# Patient Record
Sex: Female | Born: 1960 | Race: White | Hispanic: No | Marital: Married | Smoking: Never smoker
Health system: Southern US, Community
[De-identification: ages and names within clinical notes are randomized; demographics above are authoritative.]

## PROBLEM LIST (undated history)

## (undated) DIAGNOSIS — F410 Panic disorder [episodic paroxysmal anxiety] without agoraphobia: Secondary | ICD-10-CM

## (undated) DIAGNOSIS — I10 Essential (primary) hypertension: Secondary | ICD-10-CM

## (undated) DIAGNOSIS — I639 Cerebral infarction, unspecified: Secondary | ICD-10-CM

## (undated) HISTORY — DX: Cerebral infarction, unspecified: I63.9

## (undated) HISTORY — DX: Essential (primary) hypertension: I10

---

## 1998-09-22 ENCOUNTER — Other Ambulatory Visit: Admission: RE | Admit: 1998-09-22 | Discharge: 1998-09-22 | Payer: Self-pay | Admitting: Family Medicine

## 1999-09-15 ENCOUNTER — Other Ambulatory Visit: Admission: RE | Admit: 1999-09-15 | Discharge: 1999-09-15 | Payer: Self-pay | Admitting: Family Medicine

## 2000-09-04 ENCOUNTER — Other Ambulatory Visit: Admission: RE | Admit: 2000-09-04 | Discharge: 2000-09-04 | Payer: Self-pay | Admitting: Family Medicine

## 2001-09-18 ENCOUNTER — Other Ambulatory Visit: Admission: RE | Admit: 2001-09-18 | Discharge: 2001-09-18 | Payer: Self-pay | Admitting: Family Medicine

## 2002-09-30 ENCOUNTER — Other Ambulatory Visit: Admission: RE | Admit: 2002-09-30 | Discharge: 2002-09-30 | Payer: Self-pay | Admitting: Family Medicine

## 2003-11-13 ENCOUNTER — Other Ambulatory Visit: Admission: RE | Admit: 2003-11-13 | Discharge: 2003-11-13 | Payer: Self-pay | Admitting: Family Medicine

## 2004-12-08 ENCOUNTER — Ambulatory Visit: Payer: Self-pay | Admitting: Family Medicine

## 2004-12-08 ENCOUNTER — Other Ambulatory Visit: Admission: RE | Admit: 2004-12-08 | Discharge: 2004-12-08 | Payer: Self-pay | Admitting: Family Medicine

## 2015-11-11 ENCOUNTER — Observation Stay (HOSPITAL_COMMUNITY)
Admission: AD | Admit: 2015-11-11 | Discharge: 2015-11-13 | Disposition: A | Discharge status: Home / Self Care | Payer: BLUE CROSS/BLUE SHIELD | Source: Other Acute Inpatient Hospital | Attending: Internal Medicine | Admitting: Internal Medicine

## 2015-11-11 ENCOUNTER — Encounter (HOSPITAL_COMMUNITY): Payer: Self-pay | Admitting: Internal Medicine

## 2015-11-11 DIAGNOSIS — R29810 Facial weakness: Secondary | ICD-10-CM | POA: Diagnosis not present

## 2015-11-11 DIAGNOSIS — I63413 Cerebral infarction due to embolism of bilateral middle cerebral arteries: Secondary | ICD-10-CM | POA: Diagnosis not present

## 2015-11-11 DIAGNOSIS — Z793 Long term (current) use of hormonal contraceptives: Secondary | ICD-10-CM | POA: Insufficient documentation

## 2015-11-11 DIAGNOSIS — F329 Major depressive disorder, single episode, unspecified: Secondary | ICD-10-CM | POA: Insufficient documentation

## 2015-11-11 DIAGNOSIS — Z823 Family history of stroke: Secondary | ICD-10-CM | POA: Insufficient documentation

## 2015-11-11 DIAGNOSIS — E785 Hyperlipidemia, unspecified: Secondary | ICD-10-CM | POA: Clinically undetermined

## 2015-11-11 DIAGNOSIS — Z79899 Other long term (current) drug therapy: Secondary | ICD-10-CM | POA: Diagnosis not present

## 2015-11-11 DIAGNOSIS — I10 Essential (primary) hypertension: Secondary | ICD-10-CM | POA: Diagnosis present

## 2015-11-11 DIAGNOSIS — R4701 Aphasia: Secondary | ICD-10-CM | POA: Diagnosis not present

## 2015-11-11 DIAGNOSIS — I63419 Cerebral infarction due to embolism of unspecified middle cerebral artery: Secondary | ICD-10-CM | POA: Diagnosis not present

## 2015-11-11 DIAGNOSIS — I63512 Cerebral infarction due to unspecified occlusion or stenosis of left middle cerebral artery: Secondary | ICD-10-CM | POA: Diagnosis present

## 2015-11-11 DIAGNOSIS — I639 Cerebral infarction, unspecified: Secondary | ICD-10-CM | POA: Diagnosis present

## 2015-11-11 HISTORY — DX: Panic disorder (episodic paroxysmal anxiety): F41.0

## 2015-11-11 LAB — TSH: TSH: 3.298 u[IU]/mL (ref 0.350–4.500)

## 2015-11-11 LAB — COMPREHENSIVE METABOLIC PANEL
ALBUMIN: 3.6 g/dL (ref 3.5–5.0)
ALT: 22 U/L (ref 14–54)
ANION GAP: 6 (ref 5–15)
AST: 27 U/L (ref 15–41)
Alkaline Phosphatase: 61 U/L (ref 38–126)
BILIRUBIN TOTAL: 0.8 mg/dL (ref 0.3–1.2)
BUN: 11 mg/dL (ref 6–20)
CO2: 26 mmol/L (ref 22–32)
Calcium: 9.4 mg/dL (ref 8.9–10.3)
Chloride: 106 mmol/L (ref 101–111)
Creatinine, Ser: 1.11 mg/dL — ABNORMAL HIGH (ref 0.44–1.00)
GFR calc Af Amer: >60 mL/min (ref >60)
GFR, EST NON AFRICAN AMERICAN: 56 mL/min — AB (ref >60)
GLUCOSE: 102 mg/dL — AB (ref 65–99)
POTASSIUM: 3.5 mmol/L (ref 3.5–5.1)
Sodium: 138 mmol/L (ref 135–145)
TOTAL PROTEIN: 6.7 g/dL (ref 6.5–8.1)

## 2015-11-11 LAB — CBC WITH DIFFERENTIAL/PLATELET
BASOS ABS: 0 10*3/uL (ref 0.0–0.1)
BASOS PCT: 0 %
Eosinophils Absolute: 0.1 10*3/uL (ref 0.0–0.7)
Eosinophils Relative: 1 %
HCT: 40.3 % (ref 36.0–46.0)
Hemoglobin: 14 g/dL (ref 12.0–15.0)
LYMPHS PCT: 28 %
Lymphs Abs: 2.9 10*3/uL (ref 0.7–4.0)
MCH: 30.8 pg (ref 26.0–34.0)
MCHC: 34.7 g/dL (ref 30.0–36.0)
MCV: 88.6 fL (ref 78.0–100.0)
Monocytes Absolute: 0.8 10*3/uL (ref 0.1–1.0)
Monocytes Relative: 8 %
NEUTROS ABS: 6.5 10*3/uL (ref 1.7–7.7)
Neutrophils Relative %: 63 %
PLATELETS: 210 10*3/uL (ref 150–400)
RBC: 4.55 MIL/uL (ref 3.87–5.11)
RDW: 12.8 % (ref 11.5–15.5)
WBC: 10.3 10*3/uL (ref 4.0–10.5)

## 2015-11-11 LAB — GLUCOSE, CAPILLARY: GLUCOSE-CAPILLARY: 88 mg/dL (ref 65–99)

## 2015-11-11 MED ORDER — ASPIRIN 300 MG RE SUPP: 300.0000 mg | Freq: Every day | RECTAL | Status: DC

## 2015-11-11 MED ORDER — SODIUM CHLORIDE 0.9 % IV SOLN: INTRAVENOUS | Status: DC

## 2015-11-11 MED ORDER — SENNOSIDES-DOCUSATE SODIUM 8.6-50 MG PO TABS: 1.0000 | ORAL_TABLET | Freq: Every evening | ORAL | Status: DC | PRN

## 2015-11-11 MED ORDER — ENOXAPARIN SODIUM 40 MG/0.4ML ~~LOC~~ SOLN
40.0000 mg | SUBCUTANEOUS | Status: DC
  Administered 2015-11-12 – 2015-11-13 (×2): 40 mg via SUBCUTANEOUS
  Filled 2015-11-11 (×2): qty 0.4

## 2015-11-11 MED ORDER — SERTRALINE HCL 100 MG PO TABS
100.0000 mg | ORAL_TABLET | Freq: Every day | ORAL | Status: DC
  Administered 2015-11-12 – 2015-11-13 (×2): 100 mg via ORAL
  Filled 2015-11-11 (×2): qty 1

## 2015-11-11 MED ORDER — ASPIRIN 325 MG PO TABS: 325.0000 mg | ORAL_TABLET | Freq: Every day | ORAL | Status: DC

## 2015-11-11 MED ORDER — STROKE: EARLY STAGES OF RECOVERY BOOK
Freq: Once | Status: AC
  Administered 2015-11-12: 02:00:00
  Filled 2015-11-11: qty 1

## 2015-11-11 NOTE — H&P (Signed)
History and Physical    Meghan Dominguez ZOX:[id number] DOB: 08/05/1960 DOA: 11/11/2015  PCP: Irena ReichmannOLLINS, DANA, DO  Patient coming from: Patient was transferred from [facility].  Chief Complaint: Difficulty speaking.  HPI: Meghan BottsDanita H Dominguez is a 55 y.o. female with history of panic attacks was referred to the ER after patient's MRI brain showed features concerning for embolic stroke. Patient has been having these symptoms over the last 2-1/2 days with complaints of difficulty speaking the patient was having expressive aphasia with headache. Patient had gone to the [facility] after 24 hours of symptoms and had CT head done which was negative as per the patient was discharged home. At that time patient states her symptoms were better. But since symptoms were recurring  patient had gone her PCP who referred her to the ER yesterday and MRI of the brain was done which showed features concerning for embolic stroke. [facility] since there was [facility] at [facility]. Patient otherwise denies any difficulty swallowing or any blurred vision or denies any weakness of upper or lower extremity. Denies any palpitations chest pain or shortness of breath.  ED Course: See history of presenting illness.  Review of Systems: As per HPI, rest all negative.   Past Medical History  Diagnosis Date  . Panic attacks     History reviewed. No pertinent past surgical history.   reports that she has never smoked. She does not have any smokeless tobacco history on file. She reports that she does not drink alcohol or use illicit drugs.  No Known Allergies  Family History  Problem Relation Age of Onset  . Stroke Father   . Stroke Maternal Uncle     Prior to Admission medications   Medication Sig Start Date End Date Taking? Authorizing Provider  hydrochlorothiazide (HYDRODIURIL) 25 MG tablet Take 1 tablet by mouth daily. 11/10/15  Yes Historical  Provider, MD  ibuprofen (ADVIL,MOTRIN) 400 MG tablet Take 400 mg by mouth every 6 (six) hours as needed.   Yes Historical Provider, MD  Norgestimate-Ethinyl Estradiol Triphasic (TRI-PREVIFEM) 0.18/0.215/0.25 MG-35 MCG tablet Take 1 tablet by mouth daily. 10/23/15  Yes Historical Provider, MD  sertraline (ZOLOFT) 100 MG tablet Take 1 tablet by mouth daily. 10/26/15  Yes Historical Provider, MD    Physical Exam: Filed Vitals:   11/11/15 1846 11/11/15 2100  Pulse: 64   Temp: 98.3 F (36.8 C) 99.6 F (37.6 C)  TempSrc: Oral   Resp: 18   Height: 5\' 5"  (1.651 m)   SpO2: 98%       Constitutional: Not in acute distress. Filed Vitals:   11/11/15 1846 11/11/15 2100  Pulse: 64   Temp: 98.3 F (36.8 C) 99.6 F (37.6 C)  TempSrc: Oral   Resp: 18   Height: 5\' 5"  (1.651 m)   SpO2: 98%    Eyes: Anicteric no pallor. ENMT: No discharge from the ears eyes nose and mouth. Neck: No mass felt. No neck rigidity. Respiratory: No rhonchi or crepitations. Cardiovascular: S1 and S2 heard. Abdomen: Soft nontender bowel sounds present. Musculoskeletal: No edema. Skin: No rash. Neurologic: Alert awake oriented to time place and person. Has some expressive aphasia. No facial asymmetry. Tongue is midline. PERRLA positive. Moves all extremities 5 x 5. Psychiatric: Appears normal.   Labs on Admission: I have personally reviewed following labs and imaging studies  CBC: No results for input(s): WBC, NEUTROABS, HGB, HCT, MCV, PLT in the last 168 hours. Basic  Metabolic Panel: No results for input(s): NA, K, CL, CO2, GLUCOSE, BUN, CREATININE, CALCIUM, MG, PHOS in the last 168 hours. GFR: CrCl cannot be calculated (Unknown ideal weight.). Liver Function Tests: No results for input(s): AST, ALT, ALKPHOS, BILITOT, PROT, ALBUMIN in the last 168 hours. No results for input(s): LIPASE, AMYLASE in the last 168 hours. No results for input(s): AMMONIA in the last 168 hours. Coagulation Profile: No results for  input(s): INR, PROTIME in the last 168 hours. Cardiac Enzymes: No results for input(s): CKTOTAL, CKMB, CKMBINDEX, TROPONINI in the last 168 hours. BNP (last 3 results) No results for input(s): PROBNP in the last 8760 hours. HbA1C: No results for input(s): HGBA1C in the last 72 hours. CBG:  Recent Labs Lab 11/11/15 2145  GLUCAP 88   Lipid Profile: No results for input(s): CHOL, HDL, LDLCALC, TRIG, CHOLHDL, LDLDIRECT in the last 72 hours. Thyroid Function Tests: No results for input(s): TSH, T4TOTAL, FREET4, T3FREE, THYROIDAB in the last 72 hours. Anemia Panel: No results for input(s): VITAMINB12, FOLATE, FERRITIN, TIBC, IRON, RETICCTPCT in the last 72 hours. Urine analysis: No results found for: COLORURINE, APPEARANCEUR, LABSPEC, PHURINE, GLUCOSEU, HGBUR, BILIRUBINUR, KETONESUR, PROTEINUR, UROBILINOGEN, NITRITE, LEUKOCYTESUR Sepsis Labs: @LABRCNTIP (procalcitonin:4,lacticidven:4) )No results found for this or any previous visit (from the past 240 hour(s)).   Radiological Exams on Admission: No results found.  EKG: Independently reviewed. Normal sinus rhythm.  Assessment/Plan Principal Problem:   Embolic stroke [facility](HCC) Active Problems:   Stroke (cerebrum) (HCC)    1. Embolic stroke - I've discussed with on-call Neurologist Dr. Noel Christmasharles Stewart who will be seeing patient in consult. At this time I have placed patient on neuro checks, swalow evaluation will get MRA of the brain  2D echo carotid Doppler's closely monitor in telemetry. Get physical therapy consult. Presently on aspirin. Further recommendations per neurologist.  Patient did have elevated blood pressure recently but for now I'm holding off any antihypertensive and allowing for permissive hypertension. When necessary IV hydralazine only for systolic blood pressure more than 220.   DVT prophylaxis: Lovenox. Code Status: Full code.  Family Communication: Patient's mother and sister.  Disposition Plan: Home.  Consults  called: Neurologist.  Admission status: Observation. Telemetry.    Eduard ClosKAKRAKANDY,Mairin Lindsley N. MD [facility] Pager [phone].  If 7PM-7AM, please contact night-coverage [email or web address] Password [email or web address]  11/11/2015, 10:13 PM

## 2015-11-11 NOTE — Progress Notes (Signed)
Patient placed on telemetry stroke swallow screen completed. Q 2 hour vital signs and neuro checks initiated at 1830. Patient facial expression exhibited moderate amount of anxiety. RN reoriented patient to unit, provided an overview of stroke work-up, ensured her that RN will update her of any test or procedures if ordered and offered to contact patients family at 2010. Patient questioned if she was at the "right hospital". [facility] and was told of [facility]. Patient stated family was on their way to hospital. RN ensured patient that RN will continue to monitor and is happy to call family.

## 2015-11-12 ENCOUNTER — Observation Stay: Payer: Self-pay

## 2015-11-12 ENCOUNTER — Observation Stay (HOSPITAL_BASED_OUTPATIENT_CLINIC_OR_DEPARTMENT_OTHER): Payer: BLUE CROSS/BLUE SHIELD

## 2015-11-12 ENCOUNTER — Observation Stay (HOSPITAL_COMMUNITY): Payer: BLUE CROSS/BLUE SHIELD

## 2015-11-12 DIAGNOSIS — I6789 Other cerebrovascular disease: Secondary | ICD-10-CM

## 2015-11-12 DIAGNOSIS — E785 Hyperlipidemia, unspecified: Secondary | ICD-10-CM

## 2015-11-12 DIAGNOSIS — I63312 Cerebral infarction due to thrombosis of left middle cerebral artery: Secondary | ICD-10-CM

## 2015-11-12 DIAGNOSIS — I63413 Cerebral infarction due to embolism of bilateral middle cerebral arteries: Secondary | ICD-10-CM | POA: Diagnosis not present

## 2015-11-12 DIAGNOSIS — I63512 Cerebral infarction due to unspecified occlusion or stenosis of left middle cerebral artery: Secondary | ICD-10-CM | POA: Diagnosis not present

## 2015-11-12 DIAGNOSIS — I10 Essential (primary) hypertension: Secondary | ICD-10-CM

## 2015-11-12 LAB — LIPID PANEL
CHOLESTEROL: 217 mg/dL — AB (ref 0–200)
HDL: 42 mg/dL (ref >40)
LDL Cholesterol: 130 mg/dL — ABNORMAL HIGH (ref 0–99)
Total CHOL/HDL Ratio: 5.2 RATIO
Triglycerides: 227 mg/dL — ABNORMAL HIGH (ref <150)
VLDL: 45 mg/dL — AB (ref 0–40)

## 2015-11-12 LAB — ECHOCARDIOGRAM COMPLETE
CHL CUP MV DEC (S): 190
E/e' ratio: 10.72
EWDT: 190 ms
FS: 37 % (ref 28–44)
Height: 65 in
IVS/LV PW RATIO, ED: 1.01
LA ID, A-P, ES: 33 mm
LA diam end sys: 33 mm
LA vol: 30.7 mL
LAVOLA4C: 18.3 mL
LV E/e'average: 10.72
LV PW d: 12.6 mm — AB (ref 0.6–1.1)
LV TDI E'LATERAL: 8.92
LV TDI E'MEDIAL: 6.64
LVEEMED: 10.72
LVELAT: 8.92 cm/s
LVOT area: 3.14 cm2
LVOT diameter: 20 mm
MV pk A vel: 94.6 m/s
MV pk E vel: 95.6 m/s
MVPG: 4 mmHg
TAPSE: 20.3 mm

## 2015-11-12 LAB — RAPID URINE DRUG SCREEN, HOSP PERFORMED
Amphetamines: NOT DETECTED
BENZODIAZEPINES: NOT DETECTED
Barbiturates: NOT DETECTED
COCAINE: NOT DETECTED
OPIATES: NOT DETECTED
Tetrahydrocannabinol: NOT DETECTED

## 2015-11-12 LAB — SEDIMENTATION RATE: SED RATE: 36 mm/h — AB (ref 0–22)

## 2015-11-12 LAB — C-REACTIVE PROTEIN: CRP: 2.7 mg/dL — AB (ref <1.0)

## 2015-11-12 MED ORDER — ASPIRIN 300 MG RE SUPP: 300.0000 mg | Freq: Every day | RECTAL | Status: DC

## 2015-11-12 MED ORDER — HYDRALAZINE HCL 20 MG/ML IJ SOLN: 10.0000 mg | INTRAMUSCULAR | Status: DC | PRN

## 2015-11-12 MED ORDER — ATORVASTATIN CALCIUM 10 MG PO TABS
20.0000 mg | ORAL_TABLET | Freq: Every day | ORAL | Status: DC
  Administered 2015-11-12: 20 mg via ORAL
  Filled 2015-11-12: qty 2

## 2015-11-12 MED ORDER — ASPIRIN 325 MG PO TABS
325.0000 mg | ORAL_TABLET | Freq: Every day | ORAL | Status: DC
  Administered 2015-11-12 – 2015-11-13 (×2): 325 mg via ORAL
  Filled 2015-11-12 (×2): qty 1

## 2015-11-12 NOTE — Progress Notes (Signed)
STROKE TEAM PROGRESS NOTE   HISTORY OF PRESENT ILLNESS (per record) Meghan Dominguez is an 55 y.o. female with a history of depression and anxiety, as well as newly diagnosed hypertension, transferred from [facility] further evaluation of acute strokes. Exact onset is unclear. Patient had a headache on 11/09/2015 which was thought to be related to blood pressure and she was given a prescription for hydrochlorothiazide. She felt generally bad on the following day but had no clear deficits. She saw her primary doctor on 11/11/2015 who noticed speech output difficulty and referred [facility] for further evaluation. MRI showed no acute posterior left MCA ischemic infarction involving parietal temporal region. In addition there were anterior and posterior punctate ischemic lesions involving the right MCA territory. She has not experienced weakness and numbness involving extremities. Mild right facial droop has been noted. NIH stroke score was 2. Her LKW is unknown. Patient was not administered IV t-PA secondary to unknown LKW. She was admitted for further evaluation and treatment.   SUBJECTIVE (INTERVAL HISTORY) Her husband is at the bedside.  Overall she feels her condition is stable. She recounted HPI with Dr. Pearlean BrownieSethi. Speech difficulties remain - expressive aphasia. Husband concurs.    OBJECTIVE Temp:  [98.3 F (36.8 C)-99.6 F (37.6 C)] 99.6 F (37.6 C) (06/28 2100) Pulse Rate:  [62-73] 73 (06/29 0602) Cardiac Rhythm:  [-] Normal sinus rhythm (06/29 0700) Resp:  [18] 18 (06/28 1846) BP: (146-160)/(73-92) 156/92 mmHg (06/29 0602) SpO2:  [96 %-100 %] 100 % (06/29 0602)  CBC:   Recent Labs Lab 11/11/15 2239  WBC 10.3  NEUTROABS 6.5  HGB 14.0  HCT 40.3  MCV 88.6  PLT 210    Basic Metabolic Panel:   Recent Labs Lab 11/11/15 2239  NA 138  K 3.5  CL 106  CO2 26  GLUCOSE 102*  BUN 11  CREATININE 1.11*  CALCIUM 9.4    Lipid Panel:     Component Value  Date/Time   CHOL 217* 11/12/2015 0213   TRIG 227* 11/12/2015 0213   HDL 42 11/12/2015 0213   CHOLHDL 5.2 11/12/2015 0213   VLDL 45* 11/12/2015 0213   LDLCALC 130* 11/12/2015 0213   HgbA1c: No results found for: HGBA1C Urine Drug Screen:     Component Value Date/Time   LABOPIA NONE DETECTED 11/12/2015 0153   COCAINSCRNUR NONE DETECTED 11/12/2015 0153   LABBENZ NONE DETECTED 11/12/2015 0153   AMPHETMU NONE DETECTED 11/12/2015 0153   THCU NONE DETECTED 11/12/2015 0153   LABBARB NONE DETECTED 11/12/2015 0153     IMAGING  Mr Maxine GlennMra Head/brain Wo Cm  11/12/2015  CLINICAL DATA:  New onset speech difficulty. EXAM: MRA HEAD WITHOUT CONTRAST TECHNIQUE: Angiographic images of the Circle of Willis were obtained using MRA technique without intravenous contrast. COMPARISON:  Brain MRI from yesterday FINDINGS: Symmetric carotid arteries and branching. Mild siphon undulation is likely atherosclerotic in this patient with calcification in this region by recent head CT. No major branch occlusion or flow limiting stenosis. Symmetric vertebral arteries. Dominant right AICA and left PICA. Fetal type PCA on the left. No major branch occlusion or treatable flow limiting stenosis. Moderate left P3/4 segment narrowing that is likely atherosclerotic. No generalized beading. 1 mm outpouching from the right supraclinoid ICA which appears distinct from a tiny PCOM. IMPRESSION: 1. No major vessel occlusion or treatable stenosis. 2. Moderate distal left PCA stenosis. 3. 1 mm right supraclinoid ICA outpouching, usually infundibulum but small aneurysm cannot be excluded. Electronically Signed  By: Jonathon  Watts M.D.   On: 11/12/2015 11:33   Mr Outside Films Head/face  11/12/2015  This examination belongs to an outside facility and is stored here for comparison purposes only.  Contact the originating outside institution for any associated report or interpretation. Areas of acute infarct in the left posterior temporal and  parietal lobes consistent with left MCA branch vessel occlusion. Scattered small infarctions are seen in the right posterior frontal and parietal region consistent with small emboli n that region. No evidence of hemorrhage or mass effect.  PHYSICAL EXAM Pleasant middle-aged Caucasian lady currently not in distress. . Afebrile. Head is nontraumatic. Neck is supple without bruit.    Cardiac exam no murmur or gallop. Lungs are clear to auscultation. Distal pulses are well felt. Neurological Exam : Awake alert oriented. Mild expressive aphasia with nonfluent speech and word finding difficulties as well as occasional paraphasic errors. Good naming but poor repetition. Good comprehension. Follows 2 and 3 step commands. Pupils equal reactive. Extraocular moments are full range without nystagmus. Fundi were not visualized. Vision acuity seems adequate. Face is symmetric without weakness. Tongue is midline. Motor system exam revealed no upper or lower eczema to drift. Symmetric and equal strength in all 4 extremities. No focal weakness. Touch pinprick sensation are preserved bilaterally. Deep tendon pulses are 2+ symmetric. Plantars downgoing. Coordination is accurate. Gait was not tested. ASSESSMENT/PLAN Ms. Meghan Dominguez is a 54 y.o. female with history of depression, anxiety, and HTN presenting with HA 2 days ago and difficulty with speech output noted by PCP day of admission. She did not receive IV t-PA due to unknown LKW.   Stroke:  Left and right MCA infarcts, embolic secondary to unknown source  Had LUE numbness 2 days ago  Resultant  Expressive aphasia  MRI  R MCA anterior and posterior punctate infarcts  MRA  no significant stenosis, moderate distal left PCA stenosis. 1 mm right ICA infundibulum  Carotid Doppler  pending   2D Echo  pending   Hypercoagulable and vasculitic labs  TEE to look for embolic source. Arranged with [facility] for tomorrow.  If positive for  PFO (patent foramen ovale), check bilateral lower extremity venous dopplers to rule out DVT as possible source of stroke. (I have made patient NPO after midnight tonight).  If TEE negative, a [facility] electrophysiologist will consult and consider placement of an implantable loop recorder to evaluate for atrial fibrillation as etiology of stroke. This has been explained to patient/family by Dr. Naleyah Ohlinger and they are agreeable.   LDL 130  HgbA1c pending  Lovenox 40 mg sq daily for VTE prophylaxis Diet Heart Room service appropriate?: Yes; Fluid consistency:: Thin  No antithrombotic prior to admission, now on aspirin 325 mg daily  Patient counseled to be compliant with her antithrombotic medications  Ongoing aggressive stroke risk factor management  Therapy recommendations:  pending   Disposition:  pending   Hypertension  Stable  Permissive hypertension (OK if < 220/120) but gradually normalize in 5-7 days  Long-term BP goal normotensive  Hyperlipidemia  Home meds:  No statin  LDL 130, goal < 70  Added statin  Continue statin at discharge  Other Stroke Risk Factors  Family hx stroke (father & maternal uncle)  Other Active Problems  Hx anxiety  Hospital day # 1  BIBY,SHARON  [facility] See Amion for Pager information 11/12/2015 11:02 AM  I have personally examined this patient, reviewed notes, independently   viewed imaging studies, participated in medical decision making and plan of care. I have made any additions or clarifications directly to the above note. Agree with note above.  She presented with headache and expressive language difficulties due to embolic left MCA infarct but MRI also shows additional right hemispheric infarcts making a central cardiac source of embolism  likely. She remains at risk for recurrent strokes, TIAs, neurological worsening and needs ongoing stroke evaluation. She will also need TEE and loop recorder  insertion. I had a long discussion the patient and husband and the bedside regarding her stroke, risk for recurrent strokes, need for evaluation and answered questions. Greater than 50% time during this 35 minute visit was spent on counseling and coordination of care about stroke risk, prevention and treatment Delia HeadyPramod Lavana Huckeba, MD Medical Director [facility] Pager: [phone] 11/12/2015 3:22 PM    To contact Stroke Continuity provider, please refer to [email or web address]. After hours, contact General Neurology

## 2015-11-12 NOTE — Progress Notes (Signed)
PROGRESS NOTE    Meghan Dominguez  KCL:[id number] DOB: [birth year] DOA: 11/11/2015 PCP: Janie Morning, DO  Outpatient Specialists: None    Brief Narrative:   HPI: Meghan Dominguez is a 55 y.o. female with history of panic attacks was referred to the ER after patient's MRI brain showed features concerning for embolic stroke. Patient has been having these symptoms over the last 2-1/2 days with complaints of difficulty speaking the patient was having expressive aphasia with headache. Patient had gone to the ER at [facility] after 24 hours of symptoms and had CT head done which was negative as per the patient was discharged home. At that time patient states her symptoms were better. But since symptoms were recurring patient had gone her PCP who referred her to the ER yesterday and MRI of the brain was done which showed features concerning for embolic stroke. Patient was transferred to [facility] since there was no neurologist at [facility]. Patient otherwise denies any difficulty swallowing or any blurred vision or denies any weakness of upper or lower extremity. Denies any palpitations chest pain or shortness of breath.   Assessment & Plan:   Principal Problem:   Embolic stroke [facility]) Active Problems:   Stroke (cerebrum) (HCC)  Stroke - Left and right MCA infarcts, embolic secondary to unknown source, presenting with expressive aphasia (improved) Patient was admitted for complete evaluation and risk factor modification/initiation of treatment as follows: 1. Neurology consult 2. Neuro checks 3. Swallow eval - negative 4. MRI/MRA of brain - MRI with R MCA anterior and posterior punctate lesions; MRA with no significant steonsis, moderate distal left PCA stenosis, 1 mm right ICA infundibulum 5. Echo - pending TTE, but for TEE tomorrow 6. Carotid dopplers 7. ASA 325 mg daily 8. A1c pending 9. Lipid panel - LDL 130, will need initiation of statin.  Will start Lipitor 20 mg  daily for now. 10. Permissive HTN (IV Hydralazine prn SBP >220) 11.  PT/OT/ST - outpatient OT recommended with 24 hour supervision/assistance 12. Telemetry monitoring 13. Labs - CRP elevated at 2.7; pending labs: hypercoagulability work-up, HIV, RPR, ESR   DVT prophylaxis: Lovenox. Code Status: Full code.  Family Communication: Patient and her husband Disposition Plan: Home once cleared by neurology Consults called: Neurologist; Cardiology for TEE Admission status: Observation. Telemetry.    Consultants:   Neurology  Procedures:   Echo  Carotid dopplers  Antimicrobials:   None   Subjective: Reports ongoing improvement in speech/word finding.  Would like very much to go home today.  Objective: Filed Vitals:   11/12/15 0202 11/12/15 0402 11/12/15 0602 11/12/15 1408  BP: 146/73 156/77 156/92 163/84  Pulse: 63 63 73 73  Temp:    98.3 F (36.8 C)  TempSrc:    Oral  Resp:    18  Height:      SpO2: 96% 96% 100% 100%   No intake or output data in the 24 hours ending 11/12/15 1632 There were no vitals filed for this visit.  Examination:  General exam: Appears calm and comfortable  Respiratory system: Clear to auscultation. Respiratory effort normal. Cardiovascular system: S1 & S2 heard, RRR. No JVD, murmurs, rubs, gallops or clicks. No pedal edema. Gastrointestinal system: Abdomen is nondistended, soft and nontender. No organomegaly or masses felt. Normal bowel sounds heard. Central nervous system: Alert and oriented. No focal neurological deficits. Extremities: Symmetric 5 x 5 power. Skin: No rashes, lesions or ulcers Neuro: Grossly intact, mild word-finding difficulties but generally conversant and  in NAD Psychiatry: Judgement and insight appear normal. Mood & affect appropriate.     Data Reviewed: I have personally reviewed following labs and imaging studies  CBC:  Recent Labs Lab 11/11/15 2239  WBC 10.3  NEUTROABS 6.5  HGB 14.0  HCT 40.3  MCV 88.6    PLT 700   Basic Metabolic Panel:  Recent Labs Lab 11/11/15 2239  NA 138  K 3.5  CL 106  CO2 26  GLUCOSE 102*  BUN 11  CREATININE 1.11*  CALCIUM 9.4   GFR: CrCl cannot be calculated (Unknown ideal weight.). Liver Function Tests:  Recent Labs Lab 11/11/15 2239  AST 27  ALT 22  ALKPHOS 61  BILITOT 0.8  PROT 6.7  ALBUMIN 3.6   No results for input(s): LIPASE, AMYLASE in the last 168 hours. No results for input(s): AMMONIA in the last 168 hours. Coagulation Profile: No results for input(s): INR, PROTIME in the last 168 hours. Cardiac Enzymes: No results for input(s): CKTOTAL, CKMB, CKMBINDEX, TROPONINI in the last 168 hours. BNP (last 3 results) No results for input(s): PROBNP in the last 8760 hours. HbA1C: No results for input(s): HGBA1C in the last 72 hours. CBG:  Recent Labs Lab 11/11/15 2145  GLUCAP 88   Lipid Profile:  Recent Labs  11/12/15 0213  CHOL 217*  HDL 42  LDLCALC 130*  TRIG 227*  CHOLHDL 5.2   Thyroid Function Tests:  Recent Labs  11/11/15 2239  TSH 3.298   Anemia Panel: No results for input(s): VITAMINB12, FOLATE, FERRITIN, TIBC, IRON, RETICCTPCT in the last 72 hours. Urine analysis: No results found for: COLORURINE, APPEARANCEUR, LABSPEC, PHURINE, GLUCOSEU, HGBUR, BILIRUBINUR, KETONESUR, PROTEINUR, UROBILINOGEN, NITRITE, LEUKOCYTESUR Sepsis Labs: '@LABRCNTIP'$ (procalcitonin:4,lacticidven:4)  )No results found for this or any previous visit (from the past 240 hour(s)).       Radiology Studies: Mr Jodene Nam Head/brain Wo Cm  11/12/2015  CLINICAL DATA:  New onset speech difficulty. EXAM: MRA HEAD WITHOUT CONTRAST TECHNIQUE: Angiographic images of the Circle of Willis were obtained using MRA technique without intravenous contrast. COMPARISON:  Brain MRI from yesterday FINDINGS: Symmetric carotid arteries and branching. Mild siphon undulation is likely atherosclerotic in this patient with calcification in this region by recent head CT.  No major branch occlusion or flow limiting stenosis. Symmetric vertebral arteries. Dominant right AICA and left PICA. Fetal type PCA on the left. No major branch occlusion or treatable flow limiting stenosis. Moderate left P3/4 segment narrowing that is likely atherosclerotic. No generalized beading. 1 mm outpouching from the right supraclinoid ICA which appears distinct from a tiny PCOM. IMPRESSION: 1. No major vessel occlusion or treatable stenosis. 2. Moderate distal left PCA stenosis. 3. 1 mm right supraclinoid ICA outpouching, usually infundibulum but small aneurysm cannot be excluded. Electronically Signed   By: Monte Fantasia M.D.   On: 11/12/2015 11:33   Mr Outside Films Head/face  11/12/2015  This examination belongs to an outside facility and is stored here for comparison purposes only.  Contact the originating outside institution for any associated report or interpretation.       Scheduled Meds: . aspirin  325 mg Oral Daily   Or  . aspirin  300 mg Rectal Daily  . atorvastatin  20 mg Oral q1800  . enoxaparin (LOVENOX) injection  40 mg Subcutaneous Q24H  . sertraline  100 mg Oral Daily   Continuous Infusions: . sodium chloride       LOS: 1 day    Time spent: 30 min    Anderson Malta  Lorin [facility], MD [facility] Pager [phone]  If 7PM-7AM, please contact night-coverage [email or web address] Password [facility] 11/12/2015, 4:32 PM

## 2015-11-12 NOTE — Progress Notes (Signed)
*  PRELIMINARY RESULTS* Echocardiogram 2D Echocardiogram has been performed.  Jeryl Columbialliott, Devyne Hauger 11/12/2015, 4:46 PM

## 2015-11-12 NOTE — Consult Note (Signed)
Admission H&P    Chief Complaint: New onset speech difficulty.  HPI: Meghan Dominguez is an 55 y.o. female with a history of depression and anxiety, as well as newly diagnosed hypertension, transferred from [facility] further evaluation of acute strokes. Exact onset is unclear. Patient had a headache on 11/09/2015 which was thought to be related to blood pressure and she was given a prescription for hydrochlorothiazide. She felt generally bad on the following day but had no clear deficits. She saw her primary doctor on 11/11/2015 who noticed speech output difficulty and referred her to [facility] ED for further evaluation. MRI showed no acute posterior left MCA ischemic infarction involving parietal temporal region. In addition there were anterior and posterior punctate ischemic lesions involving the right MCA territory. She has not experienced weakness and numbness involving extremities. Mild right facial droop has been noted. NIH stroke score was 2.  LSN: Unclear tPA Given: No: Unclear when last known well mRankin:  Past Medical History  Diagnosis Date  . Panic attacks     History reviewed. No pertinent past surgical history.  Family History  Problem Relation Age of Onset  . Stroke Father   . Stroke Maternal Uncle    Social History:  reports that she has never smoked. She does not have any smokeless tobacco history on file. She reports that she does not drink alcohol or use illicit drugs.  Allergies: No Known Allergies  Medications Prior to Admission  Medication Sig Dispense Refill  . hydrochlorothiazide (HYDRODIURIL) 25 MG tablet Take 1 tablet by mouth daily.    Marland Kitchen ibuprofen (ADVIL,MOTRIN) 400 MG tablet Take 400 mg by mouth every 6 (six) hours as needed.    . Norgestimate-Ethinyl Estradiol Triphasic (TRI-PREVIFEM) 0.18/0.215/0.25 MG-35 MCG tablet Take 1 tablet by mouth daily.    . sertraline (ZOLOFT) 100 MG tablet Take 1 tablet by mouth daily.      ROS: History  obtained from the patient  General ROS: negative for - chills, fatigue, fever, night sweats, weight gain or weight loss Psychological ROS: negative for - behavioral disorder, hallucinations, memory difficulties, mood swings or suicidal ideation Ophthalmic ROS: negative for - blurry vision, double vision, eye pain or loss of vision ENT ROS: negative for - epistaxis, nasal discharge, oral lesions, sore throat, tinnitus or vertigo Allergy and Immunology ROS: negative for - hives or itchy/watery eyes Hematological and Lymphatic ROS: negative for - bleeding problems, bruising or swollen lymph nodes Endocrine ROS: negative for - galactorrhea, hair pattern changes, polydipsia/polyuria or temperature intolerance Respiratory ROS: negative for - cough, hemoptysis, shortness of breath or wheezing Cardiovascular ROS: negative for - chest pain, dyspnea on exertion, edema or irregular heartbeat Gastrointestinal ROS: negative for - abdominal pain, diarrhea, hematemesis, nausea/vomiting or stool incontinence Genito-Urinary ROS: negative for - dysuria, hematuria, incontinence or urinary frequency/urgency Musculoskeletal ROS: negative for - joint swelling or muscular weakness Neurological ROS: as noted in HPI Dermatological ROS: negative for rash and skin lesion changes  Physical Examination: Pulse 64, temperature 99.6 F (37.6 C), temperature source Oral, resp. rate 18, height '5\' 5"'$  (1.651 m), SpO2 98 %.  HEENT-  Normocephalic, no lesions, without obvious abnormality.  Normal external eye and conjunctiva.  Normal TM's bilaterally.  Normal auditory canals and external ears. Normal external nose, mucus membranes and septum.  Normal pharynx. Neck supple with no masses, nodes, nodules or enlargement. Cardiovascular - regular rate and rhythm, S1, S2 normal, no murmur, click, rub or gallop Lungs - chest clear, no wheezing, rales, normal  symmetric air entry Abdomen - soft, non-tender; bowel sounds normal; no  masses,  no organomegaly Extremities - no joint deformities, effusion, or inflammation and no edema  Neurologic Examination: Mental Status: Alert, oriented, thought content appropriate.  Moderate expressive aphasia with word finding difficulty, as well as occasional paraphasic error. Able to follow commands without difficulty. Cranial Nerves: II-Visual fields were normal. III/IV/VI-Pupils were equal and reacted normally to light. Extraocular movements were full and conjugate.    V/VII-no facial numbness; mild right lower facial weakness. VIII-normal. X-no dysarthria; symmetrical palatal movement. XI: trapezius strength/neck flexion strength normal bilaterally XII-midline tongue extension with normal strength. Motor: 5/5 bilaterally with normal tone and bulk Sensory: Normal throughout. Deep Tendon Reflexes: 2+ and symmetric. Plantars: Flexor bilaterally Cerebellar: Normal finger-to-nose testing. Carotid auscultation: Normal  Results for orders placed or performed during the hospital encounter of 11/11/15 (from the past 48 hour(s))  Glucose, capillary     Status: None   Collection Time: 11/11/15  9:45 PM  Result Value Ref Range   Glucose-Capillary 88 65 - 99 mg/dL   Comment 1 Notify RN    Comment 2 Document in Chart   Comprehensive metabolic panel     Status: Abnormal   Collection Time: 11/11/15 10:39 PM  Result Value Ref Range   Sodium 138 135 - 145 mmol/L   Potassium 3.5 3.5 - 5.1 mmol/L   Chloride 106 101 - 111 mmol/L   CO2 26 22 - 32 mmol/L   Glucose, Bld 102 (H) 65 - 99 mg/dL   BUN 11 6 - 20 mg/dL   Creatinine, Ser 1.11 (H) 0.44 - 1.00 mg/dL   Calcium 9.4 8.9 - 10.3 mg/dL   Total Protein 6.7 6.5 - 8.1 g/dL   Albumin 3.6 3.5 - 5.0 g/dL   AST 27 15 - 41 U/L   ALT 22 14 - 54 U/L   Alkaline Phosphatase 61 38 - 126 U/L   Total Bilirubin 0.8 0.3 - 1.2 mg/dL   GFR calc non Af Amer 56 (L) >60 mL/min   GFR calc Af Amer >60 >60 mL/min    Comment: (NOTE) The eGFR has been  calculated using the CKD EPI equation. This calculation has not been validated in all clinical situations. eGFR's persistently <60 mL/min signify possible Chronic Kidney Disease.    Anion gap 6 5 - 15  CBC with Differential/Platelet     Status: None   Collection Time: 11/11/15 10:39 PM  Result Value Ref Range   WBC 10.3 4.0 - 10.5 K/uL   RBC 4.55 3.87 - 5.11 MIL/uL   Hemoglobin 14.0 12.0 - 15.0 g/dL   HCT 40.3 36.0 - 46.0 %   MCV 88.6 78.0 - 100.0 fL   MCH 30.8 26.0 - 34.0 pg   MCHC 34.7 30.0 - 36.0 g/dL   RDW 12.8 11.5 - 15.5 %   Platelets 210 150 - 400 K/uL   Neutrophils Relative % 63 %   Neutro Abs 6.5 1.7 - 7.7 K/uL   Lymphocytes Relative 28 %   Lymphs Abs 2.9 0.7 - 4.0 K/uL   Monocytes Relative 8 %   Monocytes Absolute 0.8 0.1 - 1.0 K/uL   Eosinophils Relative 1 %   Eosinophils Absolute 0.1 0.0 - 0.7 K/uL   Basophils Relative 0 %   Basophils Absolute 0.0 0.0 - 0.1 K/uL  TSH     Status: None   Collection Time: 11/11/15 10:39 PM  Result Value Ref Range   TSH 3.298 0.350 - 4.500 uIU/mL  No results found.  Assessment: 55 y.o. female with multiple risk factors for stroke presenting with acute left posterior MCA territory ischemic infarction as well as multiple small punctate anterior and posterior right MCA territory infarctions. Pattern is suggestive of possible proximal embolus source.  Stroke Risk Factors - family history and hypertension  Plan: 1. HgbA1c, fasting lipid panel 2. MRI, MRA  of the brain without contrast 3. PT consult, OT consult, Speech consult 4. Echocardiogram 5. Carotid dopplers 6. Prophylactic therapy-Antiplatelet med: Aspirin 325 mg per day 7. Risk factor modification 8. Telemetry monitoring  C.R. Nicole Kindred, MD [facility] [phone]  11/12/2015, 12:47 AM

## 2015-11-12 NOTE — Discharge Summary (Addendum)
Physician Discharge Summary  Meghan Dominguez KZL:[id number] DOB: Dec 27, 1960 DOA: 11/11/2015  PCP: Janie Morning, DO  Admit date: 11/11/2015 Discharge date: 11/13/15  Time spent: 35 minutes  Recommendations for Outpatient Follow-up:  Patient will be discharged to home.  Suggest outpatient occupational therapy and 24 hour assistance for now.  Patient will need to follow up with primary care provider within one week of discharge for blood pressure check, blood work, and to arrange for 30 day heart monitor.  Patient should continue medications as prescribed.  Patient should follow a heart healthy diet.    Discharge Diagnoses:  Active Problems:   Stroke (cerebrum) (HCC)   Acute ischemic left MCA stroke (HCC)   Essential hypertension   Hyperlipidemia LDL goal <100   Discharge Condition: Improved  Diet recommendation: Heart healthy  Filed Weights   11/13/15 0825  Weight: 74.844 kg (165 lb)    History of present illness:  HPI: Meghan Dominguez is a 55 y.o. female with history of panic attacks was referred to the ER after patient's MRI brain showed features concerning for embolic stroke. Patient has been having these symptoms over the last 2-1/2 days with complaints of difficulty speaking the patient was having expressive aphasia with headache. Patient had gone to the ER at [facility] after 24 hours of symptoms and had CT head done which was negative as per the patient was discharged home. At that time patient states her symptoms were better. But since symptoms were recurring patient had gone her PCP who referred her to the ER yesterday and MRI of the brain was done which showed features concerning for embolic stroke. Patient was transferred to [facility] since there was no neurologist at [facility]. Patient otherwise denies any difficulty swallowing or any blurred vision or denies any weakness of upper or lower extremity. Denies any palpitations chest pain or shortness of  breath.  Hospital Course:  Principal Problem:  Embolic stroke [facility]) Active Problems:  Stroke (cerebrum) (HCC)  Stroke - Left and right MCA infarcts, embolic secondary to unknown source, presenting with expressive aphasia (improved) Patient was admitted for complete evaluation and risk factor modification/initiation of treatment as follows: 1. Neurology consult 2. Swallow eval - negative 3. MRI/MRA of brain - MRI with R MCA anterior and posterior punctate lesions; MRA with no significant steonsis, moderate distal left PCA stenosis, 1 mm right ICA infundibulum 4.  TTE - EF 60-65%; moderate LVH; normal wall motion; diastolic dysfunction; indeterminate LV filling pressure; normal LA size, normal IVC 5.  TEE - normal LV function; mild AI; negative saline microcavitation study 6. Carotid dopplers - 1-39% ICA plaquing.  Vertebral artery flow is antegrade. 7. ASA 325 mg daily 8. A1c 5.3 9. Lipid panel - LDL 130, will need initiation of statin. Will start Lipitor 20 mg daily for now. 10. Permissive HTN while hospitalization.  Will continue HCTZ 25 mg daily and start Lisinopril 10 mg daily. 11. PT/OT/ST - outpatient OT recommended with 24 hour supervision/assistance.  No PT required. 12. Labs - CRP elevated at 2.7; pending labs: hypercoagulability work-up, HIV, RPR, ESR 13. Electrophysiologist consult - Declines implantable loop recorder.  Prefers to have 30-day monitor arranged by PCP in [place]. 2. Discontinue birth control pills.  Procedures:  TTE  TEE  Carotid dopplers  Consultations:  Neurology  PT  OT  ST  Electrophysiology  Discharge Exam: Filed Vitals:   11/13/15 0930 11/13/15 1049  BP: 159/102 165/94  Pulse: 68 65  Temp:  97.7 F (  36.5 C)  Resp: 18 16     General: Well developed, well nourished, NAD, appears stated age; much less difficulty with speech today, minimal word searching, moderately good balance  HEENT: NCAT, PERRLA, EOMI, Anicteic Sclera,  mucous membranes moist.  Neck: Supple, no JVD, no masses  Cardiovascular: S1 S2 auscultated, no rubs, murmurs or gallops. Regular rate and rhythm.  Respiratory: Clear to auscultation bilaterally with equal chest rise  Abdomen: Soft, nontender, nondistended, + bowel sounds  Extremities: warm dry without cyanosis clubbing or edema  Neuro: AAOx3, cranial nerves grossly intact. Strength 5/5 in patient's upper and lower extremities bilaterally, ambulates without assistance and without difficulty  Skin: Without rashes exudates or nodules  Psych: Normal affect and demeanor with intact judgement and insight  Discharge Instructions      Discharge Instructions    Ambulatory referral to Neurology    Complete by:  As directed   Please schedule post stroke follow up in 2 months.     Call MD for:  difficulty breathing, headache or visual disturbances    Complete by:  As directed      Call MD for:  persistant dizziness or light-headedness    Complete by:  As directed      Call MD for:  severe uncontrolled pain    Complete by:  As directed      Call MD for:  temperature >100.4    Complete by:  As directed      Diet - low sodium heart healthy    Complete by:  As directed      Discharge instructions    Complete by:  As directed   Patient will be discharged to home.  Suggest outpatient occupational therapy and 24 hour assistance for now.  Patient will need to follow up with primary care provider within one week of discharge for blood pressure check, blood work, and to arrange for 30 day heart monitor.  Patient should continue medications as prescribed.  Patient should follow a heart healthy diet.     Increase activity slowly    Complete by:  As directed             Medication List    STOP taking these medications        TRI-PREVIFEM 0.18/0.215/0.25 MG-35 MCG tablet  Generic drug:  Norgestimate-Ethinyl Estradiol Triphasic      TAKE these medications        aspirin 325 MG tablet    Take 1 tablet (325 mg total) by mouth daily.     atorvastatin 20 MG tablet  Commonly known as:  LIPITOR  Take 1 tablet (20 mg total) by mouth daily at 6 PM.     hydrochlorothiazide 25 MG tablet  Commonly known as:  HYDRODIURIL  Take 1 tablet by mouth daily.     ibuprofen 400 MG tablet  Commonly known as:  ADVIL,MOTRIN  Take 400 mg by mouth every 6 (six) hours as needed.     lisinopril 10 MG tablet  Commonly known as:  PRINIVIL  Take 1 tablet (10 mg total) by mouth daily.     sertraline 100 MG tablet  Commonly known as:  ZOLOFT  Take 1 tablet by mouth daily.       No Known Allergies Follow-up Information    Follow up with SETHI,PRAMOD, MD In 2 months.   Specialties:  Neurology, Radiology   Why:  Stroke Clinic, Office will call you with appointment date & time   Contact information:  [address] [place] [address] [phone]       Follow up with COLLINS, DANA, DO In 1 week.   Specialty:  Family Medicine   Why:  check blood pressure and blood work in hospital follow-up   Contact information:   [place] [address] [phone]        The results of significant diagnostics from this hospitalization (including imaging, microbiology, ancillary and laboratory) are listed below for reference.    Significant Diagnostic Studies: Mr Jodene Nam Head/brain Wo Cm  11/12/2015  CLINICAL DATA:  New onset speech difficulty. EXAM: MRA HEAD WITHOUT CONTRAST TECHNIQUE: Angiographic images of the Circle of Willis were obtained using MRA technique without intravenous contrast. COMPARISON:  Brain MRI from yesterday FINDINGS: Symmetric carotid arteries and branching. Mild siphon undulation is likely atherosclerotic in this patient with calcification in this region by recent head CT. No major branch occlusion or flow limiting stenosis. Symmetric vertebral arteries. Dominant right AICA and left PICA. Fetal type PCA on the left. No major branch occlusion  or treatable flow limiting stenosis. Moderate left P3/4 segment narrowing that is likely atherosclerotic. No generalized beading. 1 mm outpouching from the right supraclinoid ICA which appears distinct from a tiny PCOM. IMPRESSION: 1. No major vessel occlusion or treatable stenosis. 2. Moderate distal left PCA stenosis. 3. 1 mm right supraclinoid ICA outpouching, usually infundibulum but small aneurysm cannot be excluded. Electronically Signed   By: Monte Fantasia M.D.   On: 11/12/2015 11:33   Mr Outside Films Head/face  11/12/2015  This examination belongs to an outside facility and is stored here for comparison purposes only.  Contact the originating outside institution for any associated report or interpretation.   Microbiology: No results found for this or any previous visit (from the past 240 hour(s)).   Labs: Basic Metabolic Panel:  Recent Labs Lab 11/11/15 2239  NA 138  K 3.5  CL 106  CO2 26  GLUCOSE 102*  BUN 11  CREATININE 1.11*  CALCIUM 9.4   Liver Function Tests:  Recent Labs Lab 11/11/15 2239  AST 27  ALT 22  ALKPHOS 61  BILITOT 0.8  PROT 6.7  ALBUMIN 3.6   No results for input(s): LIPASE, AMYLASE in the last 168 hours. No results for input(s): AMMONIA in the last 168 hours. CBC:  Recent Labs Lab 11/11/15 2239  WBC 10.3  NEUTROABS 6.5  HGB 14.0  HCT 40.3  MCV 88.6  PLT 210   Cardiac Enzymes: No results for input(s): CKTOTAL, CKMB, CKMBINDEX, TROPONINI in the last 168 hours. BNP: BNP (last 3 results) No results for input(s): BNP in the last 8760 hours.  ProBNP (last 3 results) No results for input(s): PROBNP in the last 8760 hours.  CBG:  Recent Labs Lab 11/11/15 2145  GLUCAP 88       Signed:  Karmen Bongo  [facility] 11/13/2015, 2:14 PM

## 2015-11-12 NOTE — Evaluation (Addendum)
Occupational Therapy Evaluation Patient Details Name: Meghan JunesDanita H Gorka MRN: [id number] DOB: 07/26/1960 Today's Date: 11/12/2015    History of Present Illness 55 y.o. female with a history of depression and anxiety, as well as newly diagnosed hypertension. MRI on 6/29 + for acute infarct in the left posterior temporal and parietal lobes consistent with left MCA branch vessel occlusion. Scattered small infarctions are seen in the right posterior frontal and parietal region consistent with small emboli in that region.     Clinical Impression   Pt reports she was independent with ADLs and mobility PTA; works in an office setting. Pt currently mod I with ADLs and functional mobility. Pt presenting with UB fine and gross motor coordination deficits and word finding difficulties. Educated pt on fine/gross motor coordination exercises and provided handout. Recommending outpatient OT and SLP at this time to assess cognition and assist with return to functional independence with work activities. Pt would benefit from continued skilled OT to address established goals.    Follow Up Recommendations  Outpatient OT;Supervision/Assistance - 24 hour;Other (comment) (Outpatient SLP)    Equipment Recommendations  None recommended by OT    Recommendations for Other Services Speech consult     Precautions / Restrictions Precautions Precautions: None Restrictions Weight Bearing Restrictions: No      Mobility Bed Mobility Overal bed mobility: Modified Independent             General bed mobility comments: HOB elevated.  Transfers Overall transfer level: Modified independent Equipment used: None             General transfer comment: No unsteadiness or LOB noted.    Balance Overall balance assessment: No apparent balance deficits (not formally assessed)                                          ADL Overall ADL's : Needs assistance/impaired                                        General ADL Comments: Pt overall mod I with ADLs and functional mobility. Pt presenting with UE fine and gross motor coordination deficits impacting participation in simulated work activities; typing, writing.      Vision Vision Assessment?: No apparent visual deficits   Perception     Praxis      Pertinent Vitals/Pain Pain Assessment: No/denies pain     Hand Dominance Right   Extremity/Trunk Assessment Upper Extremity Assessment Upper Extremity Assessment: RUE deficits/detail;LUE deficits/detail RUE Deficits / Details: AROM WFL. Strength grossly 4+/5. Deecreased fine and gross motor coordination. RUE Coordination: decreased fine motor;decreased gross motor LUE Deficits / Details: AROM WFL. Strength grossly 4+/5. Deecreased fine and gross motor coordination. LUE Coordination: decreased fine motor;decreased gross motor   Lower Extremity Assessment Lower Extremity Assessment: Defer to PT evaluation   Cervical / Trunk Assessment Cervical / Trunk Assessment: Normal   Communication Communication Communication: Expressive difficulties (word finding difficulties)   Cognition Arousal/Alertness: Awake/alert Behavior During Therapy: WFL for tasks assessed/performed Overall Cognitive Status: Difficult to assess                     General Comments       Exercises Exercises: Other exercises Other Exercises Other Exercises: Educated pt on fine and gross motor coordination activities  and provided handout.   Shoulder Instructions      Home Living Family/patient expects to be discharged to:: Private residence Living Arrangements: Spouse/significant other Available Help at Discharge: Family;Available PRN/intermittently (husband works during the day but can take time off if needed) Type of Home: House Home Access: Stairs to enter Entergy CorporationEntrance Stairs-Number of Steps: 4 Entrance Stairs-Rails: None Home Layout: Two level;Able to live on main level  with bedroom/bathroom     Bathroom Shower/Tub: Producer, television/film/videoWalk-in shower   Bathroom Toilet: Handicapped height     Home Equipment: None          Prior Functioning/Environment Level of Independence: Independent        Comments: works in an office, drives    OT Diagnosis: Cognitive deficits   OT Problem List: Decreased coordination;Decreased cognition;Decreased safety awareness   OT Treatment/Interventions: Therapeutic exercise;Therapeutic activities;Cognitive remediation/compensation;Patient/family education    OT Goals(Current goals can be found in the care plan section) Acute Rehab OT Goals Patient Stated Goal: return to PLOF OT Goal Formulation: With patient Time For Goal Achievement: 11/26/15 Potential to Achieve Goals: Good ADL Goals Pt/caregiver will Perform Home Exercise Program: Both right and left upper extremity;With written HEP provided;Independently (increase fine and gross motor coordination)  OT Frequency: Min 2X/week   Barriers to D/C:            Co-evaluation PT/OT/SLP Co-Evaluation/Treatment: Yes Reason for Co-Treatment: For patient/therapist safety   OT goals addressed during session: ADL's and self-care;Other (comment) (mobility)      End of Session    Activity Tolerance: Patient tolerated treatment well Patient left: in bed;with call bell/phone within reach;with bed alarm set   Time: [id number] OT Time Calculation (min): 36 min Charges:  OT General Charges $OT Visit: 1 Procedure OT Evaluation $OT Eval Moderate Complexity: 1 Procedure G-Codes: OT G-codes **NOT FOR INPATIENT CLASS** Functional Assessment Tool Used: Clinical judgement Functional Limitation: Self care Self Care Current Status (W1191(G8987): At least 1 percent but less than 20 percent impaired, limited or restricted Self Care Goal Status (Y7829(G8988): 0 percent impaired, limited or restricted   Gaye AlkenBailey A Sherby Moncayo M.S., OTR/L Pager: [phone]  11/12/2015, 3:59 PM

## 2015-11-12 NOTE — Evaluation (Signed)
Physical Therapy Evaluation Patient Details Name: Meghan JunesDanita H Haberl MRN: [id number] DOB: 11/13/1960 Today's Date: 11/12/2015   History of Present Illness  55 y.o. female with a history of depression and anxiety, as well as newly diagnosed hypertension. MRI on 6/29 + for acute infarct in the left posterior temporal and parietal lobes consistent with left MCA branch vessel occlusion. Scattered small infarctions are seen in the right posterior frontal and parietal region consistent with small emboli in that region.    Clinical Impression  Pt is at or close to baseline functioning for mobility, but has some planning, execution and word finding problems that can be addressed by OT/SLP.   Pt should be safe at home with family assist. There are no further acute PT needs.  Will sign off at this time.     Follow Up Recommendations No PT follow up    Equipment Recommendations  None recommended by PT    Recommendations for Other Services       Precautions / Restrictions Precautions Precautions: None Restrictions Weight Bearing Restrictions: No      Mobility  Bed Mobility Overal bed mobility: Modified Independent             General bed mobility comments: HOB elevated.  Transfers Overall transfer level: Modified independent Equipment used: None             General transfer comment: No unsteadiness or LOB noted.  Ambulation/Gait Ambulation/Gait assistance: Modified independent (Device/Increase time) Ambulation Distance (Feet): 200 Feet Assistive device: None Gait Pattern/deviations: Step-through pattern   Gait velocity interpretation: at or above normal speed for age/gender General Gait Details: steady and accepts moderate challenge well incl stepping over obstacles, abrupt turns, backing up, scanning and stairs  Stairs Stairs: Yes Stairs assistance: Modified independent (Device/Increase time) Stair Management: One rail Right;Alternating pattern;Forwards Number of  Stairs: 4 General stair comments: safe  Wheelchair Mobility    Modified Rankin (Stroke Patients Only) Modified Rankin (Stroke Patients Only) Pre-Morbid Rankin Score: No symptoms Modified Rankin: Moderate disability     Balance Overall balance assessment: Modified Independent                                           Pertinent Vitals/Pain Pain Assessment: No/denies pain    Home Living Family/patient expects to be discharged to:: Private residence Living Arrangements: Spouse/significant other Available Help at Discharge: Family;Available PRN/intermittently (husband works during the day but can take time off if needed) Type of Home: House Home Access: Stairs to enter Entrance Stairs-Rails: None Entrance Stairs-Number of Steps: 4 Home Layout: Two level;Able to live on main level with bedroom/bathroom Home Equipment: None      Prior Function Level of Independence: Independent         Comments: works in an office, drives     Hand Dominance   Dominant Hand: Right    Extremity/Trunk Assessment   Upper Extremity Assessment: RUE deficits/detail;LUE deficits/detail RUE Deficits / Details: AROM WFL. Strength grossly 4+/5. Deecreased fine and gross motor coordination.     LUE Deficits / Details: AROM WFL. Strength grossly 4+/5. Deecreased fine and gross motor coordination.   Lower Extremity Assessment: Overall WFL for tasks assessed      Cervical / Trunk Assessment: Normal  Communication   Communication: Expressive difficulties (word finding difficulties)  Cognition Arousal/Alertness: Awake/alert Behavior During Therapy: WFL for tasks assessed/performed Overall Cognitive Status: Difficult to assess  General Comments General comments (skin integrity, edema, etc.): Demos some difficulty with word finding, typing what is told to her or typing what she wants to type.  Think pt needs SLP cog/lang eval.    Exercises  Other Exercises Other Exercises: Educated pt on fine and gross motor coordination activities and provided handout.      Assessment/Plan    PT Assessment Patent does not need any further PT services  PT Diagnosis     PT Problem List    PT Treatment Interventions     PT Goals (Current goals can be found in the Care Plan section) Acute Rehab PT Goals Patient Stated Goal: return to PLOF PT Goal Formulation: All assessment and education complete, DC therapy    Frequency     Barriers to discharge        Co-evaluation PT/OT/SLP Co-Evaluation/Treatment: Yes Reason for Co-Treatment: For patient/therapist safety PT goals addressed during session: Mobility/safety with mobility OT goals addressed during session: ADL's and self-care;Other (comment) (mobility)       End of Session   Activity Tolerance: Patient tolerated treatment well Patient left: in bed Nurse Communication: Mobility status    Functional Assessment Tool Used: clinical judgement Functional Limitation: Mobility: Walking and moving around Mobility: Walking and Moving Around Current Status ([id number](G8978): 0 percent impaired, limited or restricted Mobility: Walking and Moving Around Discharge Status [phone](G8980): 0 percent impaired, limited or restricted    Time: [id number] PT Time Calculation (min) (ACUTE ONLY): 36 min   Charges:   PT Evaluation $PT Eval Low Complexity: 1 Procedure     PT G Codes:   PT G-Codes **NOT FOR INPATIENT CLASS** Functional Assessment Tool Used: clinical judgement Functional Limitation: Mobility: Walking and moving around Mobility: Walking and Moving Around Current Status (N8295(G8978): 0 percent impaired, limited or restricted Mobility: Walking and Moving Around Discharge Status (A2130(G8980): 0 percent impaired, limited or restricted    Claus Silvestro, Eliseo GumKenneth V 11/12/2015, 4:55 PM 11/12/2015  [place] BingKen Makeya Hilgert, PT [phone]  (pager)

## 2015-11-12 NOTE — Progress Notes (Addendum)
VASCULAR LAB PRELIMINARY  PRELIMINARY  PRELIMINARY  PRELIMINARY  Carotid duplex completed.    Preliminary report:  1-39% ICA plaquing.  Vertebral artery flow is antegrade.   Suhas Estis, RVT 11/12/2015, 4:36 PM

## 2015-11-12 NOTE — Progress Notes (Signed)
Entry Error

## 2015-11-13 ENCOUNTER — Observation Stay (HOSPITAL_BASED_OUTPATIENT_CLINIC_OR_DEPARTMENT_OTHER): Payer: BLUE CROSS/BLUE SHIELD

## 2015-11-13 ENCOUNTER — Encounter (HOSPITAL_COMMUNITY)
Admission: AD | Disposition: A | Discharge status: Home / Self Care | Payer: Self-pay | Source: Other Acute Inpatient Hospital | Attending: Internal Medicine

## 2015-11-13 ENCOUNTER — Encounter (HOSPITAL_COMMUNITY): Payer: Self-pay | Admitting: *Deleted

## 2015-11-13 DIAGNOSIS — I63512 Cerebral infarction due to unspecified occlusion or stenosis of left middle cerebral artery: Secondary | ICD-10-CM

## 2015-11-13 DIAGNOSIS — E785 Hyperlipidemia, unspecified: Secondary | ICD-10-CM | POA: Clinically undetermined

## 2015-11-13 DIAGNOSIS — I63413 Cerebral infarction due to embolism of bilateral middle cerebral arteries: Secondary | ICD-10-CM | POA: Diagnosis not present

## 2015-11-13 DIAGNOSIS — I351 Nonrheumatic aortic (valve) insufficiency: Secondary | ICD-10-CM | POA: Diagnosis not present

## 2015-11-13 DIAGNOSIS — I63419 Cerebral infarction due to embolism of unspecified middle cerebral artery: Secondary | ICD-10-CM

## 2015-11-13 DIAGNOSIS — I10 Essential (primary) hypertension: Secondary | ICD-10-CM | POA: Diagnosis present

## 2015-11-13 HISTORY — PX: TEE WITHOUT CARDIOVERSION: SHX5443

## 2015-11-13 LAB — CARDIOLIPIN ANTIBODIES, IGG, IGM, IGA
ANTICARDIOLIPIN IGM: 9 [MPL'U]/mL (ref 0–12)
Anticardiolipin IgA: <9 APL U/mL (ref 0–11)

## 2015-11-13 LAB — ECHO TEE: AOASC: 31 cm

## 2015-11-13 LAB — HEMOGLOBIN A1C
HEMOGLOBIN A1C: 5.3 % (ref 4.8–5.6)
MEAN PLASMA GLUCOSE: 105 mg/dL

## 2015-11-13 LAB — C4 COMPLEMENT: Complement C4, Body Fluid: 33 mg/dL (ref 14–44)

## 2015-11-13 LAB — LUPUS ANTICOAGULANT PANEL
DRVVT: 35.8 s (ref 0.0–47.0)
PTT LA: 33.4 s (ref 0.0–51.9)

## 2015-11-13 LAB — ANTINUCLEAR ANTIBODIES, IFA: ANTINUCLEAR ANTIBODIES, IFA: NEGATIVE

## 2015-11-13 LAB — COMPLEMENT, TOTAL: Compl, Total (CH50): >60 U/mL — ABNORMAL HIGH (ref 42–60)

## 2015-11-13 LAB — C3 COMPLEMENT: C3 Complement: 183 mg/dL — ABNORMAL HIGH (ref 82–167)

## 2015-11-13 LAB — RPR: RPR Ser Ql: NONREACTIVE

## 2015-11-13 LAB — HIV ANTIBODY (ROUTINE TESTING W REFLEX): HIV Screen 4th Generation wRfx: NONREACTIVE

## 2015-11-13 SURGERY — ECHOCARDIOGRAM, TRANSESOPHAGEAL
Anesthesia: Moderate Sedation

## 2015-11-13 MED ORDER — SODIUM CHLORIDE 0.9 % IV SOLN
INTRAVENOUS | Status: DC
  Administered 2015-11-13: 500 mL via INTRAVENOUS

## 2015-11-13 MED ORDER — LISINOPRIL 10 MG PO TABS: 10.0000 mg | ORAL_TABLET | Freq: Every day | ORAL | Status: DC

## 2015-11-13 MED ORDER — FENTANYL CITRATE (PF) 100 MCG/2ML IJ SOLN
INTRAMUSCULAR | Status: AC
  Filled 2015-11-13: qty 2

## 2015-11-13 MED ORDER — MIDAZOLAM HCL 10 MG/2ML IJ SOLN
INTRAMUSCULAR | Status: DC | PRN
  Administered 2015-11-13 (×3): 2 mg via INTRAVENOUS

## 2015-11-13 MED ORDER — ATORVASTATIN CALCIUM 20 MG PO TABS: 20.0000 mg | ORAL_TABLET | Freq: Every day | ORAL | Status: DC

## 2015-11-13 MED ORDER — FENTANYL CITRATE (PF) 100 MCG/2ML IJ SOLN
INTRAMUSCULAR | Status: DC | PRN
  Administered 2015-11-13 (×2): 25 ug via INTRAVENOUS

## 2015-11-13 MED ORDER — SODIUM CHLORIDE 0.9 % IV SOLN: INTRAVENOUS | Status: DC

## 2015-11-13 MED ORDER — ASPIRIN 325 MG PO TABS: 325.0000 mg | ORAL_TABLET | Freq: Every day | ORAL | Status: AC

## 2015-11-13 MED ORDER — MIDAZOLAM HCL 5 MG/ML IJ SOLN
INTRAMUSCULAR | Status: AC
  Filled 2015-11-13: qty 2

## 2015-11-13 MED ORDER — BUTAMBEN-TETRACAINE-BENZOCAINE 2-2-14 % EX AERO
INHALATION_SPRAY | CUTANEOUS | Status: DC | PRN
  Administered 2015-11-13: 2 via TOPICAL

## 2015-11-13 NOTE — Progress Notes (Signed)
Pt for discharge home today. Discharge orders received. IV and telemetry dcd with dressing clean dry and intact. Discharge instructions given with verbalized understanding. Prescriptions called into [facility]. SPeech therapy OP ordered. Stroke education and handouts given. Family at bedside to assist with discharge. Staff brought patient to lobby via wheelchair at this time. Transported to home by family member.

## 2015-11-13 NOTE — Care Management Note (Signed)
Case Management Note  Patient Details  Name: Meghan Dominguez MRN: [id number] Date of Birth: 12/21/1960  Subjective/Objective:                    Action/Plan: Pt discharging home with self care. OT recommending outpatient OT/ST. CM spoke with the MD and she is going to write a prescription for the outpatient services since the [place] lives in [place]. Will updated the bedside RN.   Expected Discharge Date:                  Expected Discharge Plan:  Home/Self Care  In-House Referral:     Discharge planning Services  CM Consult  Post Acute Care Choice:    Choice offered to:     DME Arranged:    DME Agency:     HH Arranged:    HH Agency:     Status of Service:  Completed, signed off  If discussed at [employer] Length of Stay Meetings, dates discussed:    Additional Comments:  Kermit BaloKelli F Machaela Caterino, RN 11/13/2015, 2:24 PM

## 2015-11-13 NOTE — Interval H&P Note (Signed)
History and Physical Interval Note:  11/13/2015 8:33 AM  Meghan Dominguez  has presented today for surgery, with the diagnosis of STROKE  The various methods of treatment have been discussed with the patient and family. After consideration of risks, benefits and other options for treatment, the patient has consented to  Procedure(s): TRANSESOPHAGEAL ECHOCARDIOGRAM (TEE)   (LOOP) (N/A) as a surgical intervention .  The patient's history has been reviewed, patient examined, no change in status, stable for surgery.  I have reviewed the patient's chart and labs.  Questions were answered to the patient's satisfaction.     Olga MillersBrian Nialah Saravia

## 2015-11-13 NOTE — Progress Notes (Signed)
Occupational Therapy Treatment Patient Details Name: Meghan Dominguez MRN: [id number] DOB: 04/02/1961 Today's Date: 11/13/2015    History of present illness 55 y.o. female with a history of depression and anxiety, as well as newly diagnosed hypertension. MRI on 6/29 + for acute infarct in the left posterior temporal and parietal lobes consistent with left MCA branch vessel occlusion. Scattered small infarctions are seen in the right posterior frontal and parietal region consistent with small emboli in that region.     OT comments  Pt able to perform toilet transfer and LB/UB dressing with mod I today. Tolerating fine/gross motor coordination exercises well. Improvements in coordination noted but continues to have residual deficits; R>L. Continue to recommend Outpatient OT and SLP for follow up. No further acute OT needs identified; signing off at this time. Please re-consult if needs change. Thank you for this referral.   Follow Up Recommendations  Outpatient OT;Supervision/Assistance - 24 hour;Other (comment) (Outpatient SLP)    Equipment Recommendations  None recommended by OT    Recommendations for Other Services Speech consult    Precautions / Restrictions Precautions Precautions: None Restrictions Weight Bearing Restrictions: No       Mobility Bed Mobility Overal bed mobility: Independent                Transfers Overall transfer level: Modified independent Equipment used: None             General transfer comment: No unsteadiness or LOB noted.    Balance Overall balance assessment: Modified Independent                                 ADL Overall ADL's : Modified independent                                       General ADL Comments: Pt able to gather clothing and dress self with mod I. Toilet transfer with mod I as well. Better fine/gross motor coordination today but still slightly impaired.      Vision                      Perception     Praxis      Cognition   Behavior During Therapy: [facility] for tasks assessed/performed Overall Cognitive Status: Within Functional Limits for tasks assessed                       Extremity/Trunk Assessment               Exercises Other Exercises Other Exercises: Pt able to return demo fine and gross motor coordination exercises.   Shoulder Instructions       General Comments      Pertinent Vitals/ Pain       Pain Assessment: No/denies pain  Home Living                                          Prior Functioning/Environment              Frequency       Progress Toward Goals  OT Goals(current goals can now be found in the care plan section)  Progress towards OT goals: Goals met/education completed, patient  discharged from OT  Acute Rehab OT Goals Patient Stated Goal: home today OT Goal Formulation: All assessment and education complete, DC therapy  Plan Discharge plan remains appropriate;All goals met and education completed, patient discharged from OT services    Co-evaluation                 End of Session     Activity Tolerance Patient tolerated treatment well   Patient Left with nursing/sitter in room;with family/visitor present   Nurse Communication          Time: 0141-0301 OT Time Calculation (min): 12 min  Charges: OT General Charges $OT Visit: 1 Procedure OT Treatments $Self Care/Home Management : 8-22 mins  Binnie Kand M.S., OTR/L Pager: [phone]  11/13/2015, 2:42 PM

## 2015-11-13 NOTE — Progress Notes (Signed)
OT Cancellation Note  Patient Details Name: Honor JunesDanita H Borman MRN: [id number] DOB: 07/10/1960   Cancelled Treatment:    Reason Eval/Treat Not Completed: Patient at procedure or test/ unavailable. Will follow up for OT treatment as time allows.  Gaye AlkenBailey A Kathrina Crosley M.S., OTR/L Pager: [phone]  11/13/2015, 9:28 AM

## 2015-11-13 NOTE — Progress Notes (Signed)
Echocardiogram Echocardiogram Transesophageal has been performed.  Meghan Dominguez 11/13/2015, 9:30 AM

## 2015-11-13 NOTE — H&P (View-Only) (Signed)
STROKE TEAM PROGRESS NOTE   HISTORY OF PRESENT ILLNESS (per record) Meghan Dominguez is an 55 y.o. female with a history of depression and anxiety, as well as newly diagnosed hypertension, transferred [facility] further evaluation of acute strokes. Exact onset is unclear. Patient had a headache on 11/09/2015 which was thought to be related to blood pressure and she was given a prescription for hydrochlorothiazide. She felt generally bad on the following day but had no clear deficits. She saw her primary doctor on 11/11/2015 who noticed speech output difficulty and referred [facility] for further evaluation. MRI showed no acute posterior left MCA ischemic infarction involving parietal temporal region. In addition there were anterior and posterior punctate ischemic lesions involving the right MCA territory. She has not experienced weakness and numbness involving extremities. Mild right facial droop has been noted. NIH stroke score was 2. Her LKW is unknown. Patient was not administered IV t-PA secondary to unknown LKW. She was admitted for further evaluation and treatment.   SUBJECTIVE (INTERVAL HISTORY) Her husband is at the bedside.  Overall she feels her condition is stable. She recounted HPI with Dr. Pearlean BrownieSethi. Speech difficulties remain - expressive aphasia. Husband concurs.    OBJECTIVE Temp:  [98.3 F (36.8 C)-99.6 F (37.6 C)] 99.6 F (37.6 C) (06/28 2100) Pulse Rate:  [62-73] 73 (06/29 0602) Cardiac Rhythm:  [-] Normal sinus rhythm (06/29 0700) Resp:  [18] 18 (06/28 1846) BP: (146-160)/(73-92) 156/92 mmHg (06/29 0602) SpO2:  [96 %-100 %] 100 % (06/29 0602)  CBC:   Recent Labs Lab 11/11/15 2239  WBC 10.3  NEUTROABS 6.5  HGB 14.0  HCT 40.3  MCV 88.6  PLT 210    Basic Metabolic Panel:   Recent Labs Lab 11/11/15 2239  NA 138  K 3.5  CL 106  CO2 26  GLUCOSE 102*  BUN 11  CREATININE 1.11*  CALCIUM 9.4    Lipid Panel:     Component Value  Date/Time   CHOL 217* 11/12/2015 0213   TRIG 227* 11/12/2015 0213   HDL 42 11/12/2015 0213   CHOLHDL 5.2 11/12/2015 0213   VLDL 45* 11/12/2015 0213   LDLCALC 130* 11/12/2015 0213   HgbA1c: No results found for: HGBA1C Urine Drug Screen:     Component Value Date/Time   LABOPIA NONE DETECTED 11/12/2015 0153   COCAINSCRNUR NONE DETECTED 11/12/2015 0153   LABBENZ NONE DETECTED 11/12/2015 0153   AMPHETMU NONE DETECTED 11/12/2015 0153   THCU NONE DETECTED 11/12/2015 0153   LABBARB NONE DETECTED 11/12/2015 0153     IMAGING  Mr Maxine GlennMra Head/brain Wo Cm  11/12/2015  CLINICAL DATA:  New onset speech difficulty. EXAM: MRA HEAD WITHOUT CONTRAST TECHNIQUE: Angiographic images of the Circle of Willis were obtained using MRA technique without intravenous contrast. COMPARISON:  Brain MRI from yesterday FINDINGS: Symmetric carotid arteries and branching. Mild siphon undulation is likely atherosclerotic in this patient with calcification in this region by recent head CT. No major branch occlusion or flow limiting stenosis. Symmetric vertebral arteries. Dominant right AICA and left PICA. Fetal type PCA on the left. No major branch occlusion or treatable flow limiting stenosis. Moderate left P3/4 segment narrowing that is likely atherosclerotic. No generalized beading. 1 mm outpouching from the right supraclinoid ICA which appears distinct from a tiny PCOM. IMPRESSION: 1. No major vessel occlusion or treatable stenosis. 2. Moderate distal left PCA stenosis. 3. 1 mm right supraclinoid ICA outpouching, usually infundibulum but small aneurysm cannot be excluded. Electronically Signed  By: Marnee Spring M.D.   On: 11/12/2015 11:33   Mr Outside Films Head/face  11/12/2015  This examination belongs to an outside facility and is stored here for comparison purposes only.  Contact the originating outside institution for any associated report or interpretation. Areas of acute infarct in the left posterior temporal and  parietal lobes consistent with left MCA branch vessel occlusion. Scattered small infarctions are seen in the right posterior frontal and parietal region consistent with small emboli n that region. No evidence of hemorrhage or mass effect.  PHYSICAL EXAM Pleasant middle-aged Caucasian lady currently not in distress. . Afebrile. Head is nontraumatic. Neck is supple without bruit.    Cardiac exam no murmur or gallop. Lungs are clear to auscultation. Distal pulses are well felt. Neurological Exam : Awake alert oriented. Mild expressive aphasia with nonfluent speech and word finding difficulties as well as occasional paraphasic errors. Good naming but poor repetition. Good comprehension. Follows 2 and 3 step commands. Pupils equal reactive. Extraocular moments are full range without nystagmus. Fundi were not visualized. Vision acuity seems adequate. Face is symmetric without weakness. Tongue is midline. Motor system exam revealed no upper or lower eczema to drift. Symmetric and equal strength in all 4 extremities. No focal weakness. Touch pinprick sensation are preserved bilaterally. Deep tendon pulses are 2+ symmetric. Plantars downgoing. Coordination is accurate. Gait was not tested. ASSESSMENT/PLAN Ms. ZYKERIAH MATHIA is a 55 y.o. female with history of depression, anxiety, and HTN presenting with HA 2 days ago and difficulty with speech output noted by PCP day of admission. She did not receive IV t-PA due to unknown LKW.   Stroke:  Left and right MCA infarcts, embolic secondary to unknown source  Had LUE numbness 2 days ago  Resultant  Expressive aphasia  MRI  R MCA anterior and posterior punctate infarcts  MRA  no significant stenosis, moderate distal left PCA stenosis. 1 mm right ICA infundibulum  Carotid Doppler  pending   2D Echo  pending   Hypercoagulable and vasculitic labs  TEE to look for embolic source. Arranged with [facility] for tomorrow.  If positive for  PFO (patent foramen ovale), check bilateral lower extremity venous dopplers to rule out DVT as possible source of stroke. (I have made patient NPO after midnight tonight).  If TEE negative, a [facility] electrophysiologist will consult and consider placement of an implantable loop recorder to evaluate for atrial fibrillation as etiology of stroke. This has been explained to patient/family by Dr. Pearlean Brownie and they are agreeable.   LDL 130  HgbA1c pending  Lovenox 40 mg sq daily for VTE prophylaxis Diet Heart Room service appropriate?: Yes; Fluid consistency:: Thin  No antithrombotic prior to admission, now on aspirin 325 mg daily  Patient counseled to be compliant with her antithrombotic medications  Ongoing aggressive stroke risk factor management  Therapy recommendations:  pending   Disposition:  pending   Hypertension  Stable  Permissive hypertension (OK if < 220/120) but gradually normalize in 5-7 days  Long-term BP goal normotensive  Hyperlipidemia  Home meds:  No statin  LDL 130, goal < 70  Added statin  Continue statin at discharge  Other Stroke Risk Factors  Family hx stroke (father & maternal uncle)  Other Active Problems  Hx anxiety  Hospital day # 1  BIBY,SHARON  [facility] See Amion for Pager information 11/12/2015 11:02 AM  I have personally examined this patient, reviewed notes, independently  viewed imaging studies, participated in medical decision making and plan of care. I have made any additions or clarifications directly to the above note. Agree with note above.  She presented with headache and expressive language difficulties due to embolic left MCA infarct but MRI also shows additional right hemispheric infarcts making a central cardiac source of embolism  likely. She remains at risk for recurrent strokes, TIAs, neurological worsening and needs ongoing stroke evaluation. She will also need TEE and loop recorder  insertion. I had a long discussion the patient and husband and the bedside regarding her stroke, risk for recurrent strokes, need for evaluation and answered questions. Greater than 50% time during this 35 minute visit was spent on counseling and coordination of care about stroke risk, prevention and treatment Delia HeadyPramod Sethi, MD Medical Director [facility] Pager: [phone] 11/12/2015 3:22 PM    To contact Stroke Continuity provider, please refer to [email or web address]. After hours, contact General Neurology

## 2015-11-13 NOTE — Progress Notes (Signed)
STROKE TEAM PROGRESS NOTE   SUBJECTIVE (INTERVAL HISTORY) Her husband is at the bedside.  She is back from TEE, which was negative for PFO. She has met with EP and refuses loop placement. Dr. Pearlean Brownie discussed at length with her and her husband. She is agreeable to stop BCP.   OBJECTIVE Temp:  [97.7 F (36.5 C)-99.2 F (37.3 C)] 97.7 F (36.5 C) (06/30 1049) Pulse Rate:  [64-76] 65 (06/30 1049) Cardiac Rhythm:  [-] Normal sinus rhythm (06/29 1900) Resp:  [13-19] 16 (06/30 1049) BP: (155-203)/(82-104) 165/94 mmHg (06/30 1049) SpO2:  [95 %-100 %] 99 % (06/30 1049) Weight:  [74.844 kg (165 lb)] 74.844 kg (165 lb) (06/30 0825)  CBC:   Recent Labs Lab 11/11/15 2239  WBC 10.3  NEUTROABS 6.5  HGB 14.0  HCT 40.3  MCV 88.6  PLT 210    Basic Metabolic Panel:   Recent Labs Lab 11/11/15 2239  NA 138  K 3.5  CL 106  CO2 26  GLUCOSE 102*  BUN 11  CREATININE 1.11*  CALCIUM 9.4    Lipid Panel:     Component Value Date/Time   CHOL 217* 11/12/2015 0213   TRIG 227* 11/12/2015 0213   HDL 42 11/12/2015 0213   CHOLHDL 5.2 11/12/2015 0213   VLDL 45* 11/12/2015 0213   LDLCALC 130* 11/12/2015 0213   HgbA1c:  Lab Results  Component Value Date   HGBA1C 5.3 11/12/2015   Urine Drug Screen:     Component Value Date/Time   LABOPIA NONE DETECTED 11/12/2015 0153   COCAINSCRNUR NONE DETECTED 11/12/2015 0153   LABBENZ NONE DETECTED 11/12/2015 0153   AMPHETMU NONE DETECTED 11/12/2015 0153   THCU NONE DETECTED 11/12/2015 0153   LABBARB NONE DETECTED 11/12/2015 0153     IMAGING  Mr Maxine Glenn Head/brain Wo Cm  11/12/2015  CLINICAL DATA:  New onset speech difficulty. EXAM: MRA HEAD WITHOUT CONTRAST TECHNIQUE: Angiographic images of the Circle of Willis were obtained using MRA technique without intravenous contrast. COMPARISON:  Brain MRI from yesterday FINDINGS: Symmetric carotid arteries and branching. Mild siphon undulation is likely atherosclerotic in this patient with calcification in  this region by recent head CT. No major branch occlusion or flow limiting stenosis. Symmetric vertebral arteries. Dominant right AICA and left PICA. Fetal type PCA on the left. No major branch occlusion or treatable flow limiting stenosis. Moderate left P3/4 segment narrowing that is likely atherosclerotic. No generalized beading. 1 mm outpouching from the right supraclinoid ICA which appears distinct from a tiny PCOM. IMPRESSION: 1. No major vessel occlusion or treatable stenosis. 2. Moderate distal left PCA stenosis. 3. 1 mm right supraclinoid ICA outpouching, usually infundibulum but small aneurysm cannot be excluded. Electronically Signed   By: Marnee Spring M.D.   On: 11/12/2015 11:33   Mr Outside Films Head/face  11/12/2015  This examination belongs to an outside facility and is stored here for comparison purposes only.  Contact the originating outside institution for any associated report or interpretation. Areas of acute infarct in the left posterior temporal and parietal lobes consistent with left MCA branch vessel occlusion. Scattered small infarctions are seen in the right posterior frontal and parietal region consistent with small emboli n that region. No evidence of hemorrhage or mass effect.  TEE  Normal LV function; mild AI; negative saline microcavitation study  Carotid Doppler   There is 1-39% bilateral ICA stenosis. Vertebral artery flow is antegrade.    2D Echocardiogram  Left ventricle: The cavity size was normal. Wall thickness  was increased in a pattern of moderate LVH. Systolic function was normal. The estimated ejection fraction was in the range of 60% to 65%. Wall motion was normal; there were no regional wall motion abnormalities. Doppler parameters are consistent with abnormal left ventricular relaxation (grade 1 diastolic dysfunction). The E/e&' ratio is between 8-15, suggesting indeterminate LV filling pressure.   PHYSICAL EXAM Pleasant middle-aged Caucasian lady  currently not in distress. . Afebrile. Head is nontraumatic. Neck is supple without bruit.    Cardiac exam no murmur or gallop. Lungs are clear to auscultation. Distal pulses are well felt. Neurological Exam : Awake alert oriented. Mild expressive aphasia with nonfluent speech and word finding difficulties as well as occasional paraphasic errors. Good naming but poor repetition. Good comprehension. Follows 2 and 3 step commands. Pupils equal reactive. Extraocular moments are full range without nystagmus. Fundi were not visualized. Vision acuity seems adequate. Face is symmetric without weakness. Tongue is midline. Motor system exam revealed no upper or lower eczema to drift. Symmetric and equal strength in all 4 extremities. No focal weakness. Touch pinprick sensation are preserved bilaterally. Deep tendon pulses are 2+ symmetric. Plantars downgoing. Coordination is accurate. Gait was not tested.   ASSESSMENT/PLAN Ms. Meghan Dominguez is a 55 y.o. female with history of depression, anxiety, and HTN presenting with HA 2 days ago and difficulty with speech output noted by PCP day of admission. She did not receive IV t-PA due to unknown LKW.   Stroke:  Left and right MCA infarcts, embolic secondary to unknown source  Had LUE numbness 2 days ago  Resultant  Expressive aphasia  MRI  R MCA anterior and posterior punctate infarcts  MRA  no significant stenosis, moderate distal left PCA stenosis. 1 mm right ICA infundibulum  Carotid Doppler  No significant stenosis   2D Echo  EF 60-65%. No source of embolus   Hypercoagulable and vasculitic labs negative thus far  TEE no PFO, no SOE.  Patient has refused loop Dr. Leonie Man disucssed with her and her husband further. She is going to consider. Plan 30 d monitor at discharge  LDL 130  HgbA1c 5.3  Lovenox 40 mg sq daily for VTE prophylaxis Diet Heart Room service appropriate?: Yes; Fluid consistency:: Thin  No antithrombotic prior to admission, now on  aspirin 325 mg daily  Patient counseled to be compliant with her antithrombotic medications  Therapy recommendations:  No PT, OP OT   Disposition:  Return home with OP OT, SLP  Patient has a 10-15% risk of having another stroke over the next year, the highest risk is within 2 weeks of the most recent stroke/TIA (risk of having a stroke following a stroke or TIA is the same).  Ongoing risk factor control by Primary Care Physician  Stroke Service will sign off. Please call should any needs arise.  Follow-up Stroke Clinic at [facility] with Dr. Antony Contras in 2 months, order placed.  Hypertension  Stable  Permissive hypertension (OK if < 220/120) but gradually normalize in 5-7 days  Long-term BP goal normotensive  Hyperlipidemia  Home meds:  No statin  LDL 130, goal < 70  Added statin  Continue statin at discharge  Other Stroke Risk Factors  Family hx stroke (father & maternal uncle)  Patient on BCP - has not gone through menopause per her - remains on BCP for birth control. Recommend she come off - asked to discuss with gynecologist for further recommendations   Other Active Problems  Hx anxiety  Hospital day # [place] for Pager information 11/13/2015 11:03 AM  I have personally examined this patient, reviewed notes, independently viewed imaging studies, participated in medical decision making and plan of care. I have made any additions or clarifications directly to the above note. Agree with note above.  I had a long discussion with the patient and husband regarding the findings on NTG and need to do long-term cardiac monitoring to look for paroxysmal atrial fibrillation. Patient has been reluctant to have loop recorder inserted today and wants to think about it. She may want to do 30 day outpatient cardiac monitor by her cardiologist in [place]. She will return for follow-up with me in the stroke clinic in 2  months or call earlier if necessary. Greater than 50% time during this 25 minute visit was spent on counseling and coordination of care about stroke risk, risk prevention and treatment  Antony Contras, MD Medical Director [place] Pager: [phone] 11/13/2015 3:35 PM   To contact Stroke Continuity provider, please refer to [email or web address] After hours, contact General Neurology

## 2015-11-13 NOTE — Consult Note (Signed)
ELECTROPHYSIOLOGY CONSULT NOTE  Patient ID: Meghan Dominguez MRN: [id number], DOB/AGE: 54/10/1960   Admit date: 11/11/2015 Date of Consult: 11/13/2015  Primary Physician: Irena ReichmannOLLINS, DANA, DO Primary Cardiologist: none Reason for Consultation: Cryptogenic stroke ; recommendations regarding Implantable Loop Recorder  History of Present Illness Meghan Dominguez was transferred [facility]/11/2015 with CVA.  She denies any known PMHx, only take OBC pills and zoloft chronically, recently started on HCTZ for episode of HTN noted at the ED a day prior to her admission to [facility].  They first developed symptoms while at home a couple days prior with vague symptoms of something not being right.  Imaging demonstrated Left and right MCA infarcts, embolic secondary to unknown source.  she has undergone workup for stroke including echocardiogram and carotid dopplers.  The patient has been monitored on telemetry which has demonstrated sinus rhythm with no arrhythmias.  Inpatient stroke work-up is to be completed with a TEE.   Echocardiogram this admission demonstrated Study Conclusions - Left ventricle: The cavity size was normal. Wall thickness was  increased in a pattern of moderate LVH. Systolic function was  normal. The estimated ejection fraction was in the range of 60%  to 65%. Wall motion was normal; there were no regional wall  motion abnormalities. Doppler parameters are consistent with  abnormal left ventricular relaxation (grade 1 diastolic  dysfunction). The E/e&' ratio is between 8-15, suggesting  indeterminate LV filling pressure. - Left atrium: The atrium was normal in size. - Inferior vena cava: The vessel was normal in size. The  respirophasic diameter changes were in the normal range (>= 50%),  consistent with normal central venous pressure. Impressions: - LVEF 60-65%, moderate LVH, normal wall motion, diastolic  dysfunction, indeterminate LV  filling pressure, normal LA size,  normal IVC.   Lab work is reviewed.  Prior to admission, the patient denies chest pain, shortness of breath, dizziness, palpitations, or syncope.  They are recovering from their stroke with disposition plans pending at discharge.  EP has been asked to evaluate for placement of an implantable loop recorder to monitor for atrial fibrillation.     Past Medical History  Diagnosis Date  . Panic attacks      Surgical History: History reviewed. No pertinent past surgical history.   Prescriptions prior to admission  Medication Sig Dispense Refill Last Dose  . hydrochlorothiazide (HYDRODIURIL) 25 MG tablet Take 1 tablet by mouth daily.   11/10/2015 at Unknown time  . ibuprofen (ADVIL,MOTRIN) 400 MG tablet Take 400 mg by mouth every 6 (six) hours as needed.   PRN  . Norgestimate-Ethinyl Estradiol Triphasic (TRI-PREVIFEM) 0.18/0.215/0.25 MG-35 MCG tablet Take 1 tablet by mouth daily.   11/10/2015 at Unknown time  . sertraline (ZOLOFT) 100 MG tablet Take 1 tablet by mouth daily.   11/10/2015 at Unknown time    Inpatient Medications:  . aspirin  325 mg Oral Daily   Or  . aspirin  300 mg Rectal Daily  . atorvastatin  20 mg Oral q1800  . enoxaparin (LOVENOX) injection  40 mg Subcutaneous Q24H  . sertraline  100 mg Oral Daily    Allergies: No Known Allergies  Social History   Social History  . Marital Status: Married    Spouse Name: N/A  . Number of Children: N/A  . Years of Education: N/A   Occupational History  . Not on file.   Social History Main Topics  . Smoking status: Never Smoker   .  Smokeless tobacco: Not on file  . Alcohol Use: No  . Drug Use: No  . Sexual Activity: Not on file   Other Topics Concern  . Not on file   Social History Narrative  . No narrative on file     Family History  Problem Relation Age of Onset  . Stroke Father   . Stroke Maternal Uncle       Review of Systems: All other systems reviewed and are  otherwise negative except as noted above.  Physical Exam: Filed Vitals:   Dec 10, 2015 1408 12/10/2015 2115 11/13/15 0149 11/13/15 0537  BP: 163/84 171/87 174/85 175/83  Pulse: 73 72 64 73  Temp: 98.3 F (36.8 C) 99.2 F (37.3 C) 98.3 F (36.8 C) 98 F (36.7 C)  TempSrc: Oral Oral Oral Oral  Resp: Height:      SpO2: 100% 97% 98% 98%    GEN- The patient is well appearing, alert and oriented x 3 today.   Head- normocephalic, atraumatic Eyes-  Sclera clear, conjunctiva pink Ears- hearing intact Oropharynx- clear Neck- supple Lungs- Clear to ausculation bilaterally, normal work of breathing Heart- Regular rate and rhythm, no murmurs, rubs or gallops  GI- soft, NT, ND Extremities- no clubbing, cyanosis, or edema MS- no significant deformity or atrophy Skin- no rash or lesion Psych- euthymic mood, full affect   Labs:   Lab Results  Component Value Date   WBC 10.3 11/11/2015   HGB 14.0 11/11/2015   HCT 40.3 11/11/2015   MCV 88.6 11/11/2015   PLT 210 11/11/2015    Recent Labs Lab 11/11/15 2239  NA 138  K 3.5  CL 106  CO2 26  BUN 11  CREATININE 1.11*  CALCIUM 9.4  PROT 6.7  BILITOT 0.8  ALKPHOS 61  ALT 22  AST 27  GLUCOSE 102*   No results found for: CKTOTAL, CKMB, CKMBINDEX, TROPONINI Lab Results  Component Value Date   CHOL 217* 10-Dec-2015   Lab Results  Component Value Date   HDL 42 Dec 10, 2015   Lab Results  Component Value Date   LDLCALC 130* Dec 10, 2015   Lab Results  Component Value Date   TRIG 227* 10-Dec-2015   Lab Results  Component Value Date   CHOLHDL 5.2 2015/12/10     Radiology/Studies: Mr Maxine Glenn Head/brain Wo Cm Dec 10, 2015  CLINICAL DATA:  New onset speech difficulty. EXAM: MRA HEAD WITHOUT CONTRAST TECHNIQUE: Angiographic images of the Circle of Willis were obtained using MRA technique without intravenous contrast. COMPARISON:  Brain MRI from yesterday FINDINGS: Symmetric carotid arteries and branching. Mild siphon undulation  is likely atherosclerotic in this patient with calcification in this region by recent head CT. No major branch occlusion or flow limiting stenosis. Symmetric vertebral arteries. Dominant right AICA and left PICA. Fetal type PCA on the left. No major branch occlusion or treatable flow limiting stenosis. Moderate left P3/4 segment narrowing that is likely atherosclerotic. No generalized beading. 1 mm outpouching from the right supraclinoid ICA which appears distinct from a tiny PCOM. IMPRESSION: 1. No major vessel occlusion or treatable stenosis. 2. Moderate distal left PCA stenosis. 3. 1 mm right supraclinoid ICA outpouching, usually infundibulum but small aneurysm cannot be excluded. Electronically Signed   By: Marnee Spring M.D.   On: Dec 10, 2015 11:33   Mr Outside Films Head/face December 10, 2015  This examination belongs to an outside facility and is stored here for comparison purposes only.  Contact the originating outside institution for any associated report or interpretation.  12-lead ECG SR All prior EKG's in EPIC reviewed with no documented atrial fibrillation  Telemetry SR only  Assessment and Plan:  1. Cryptogenic stroke The patient presents with cryptogenic stroke.  The patient has a TEE planned for this AM.  I spoke at length with the patient about monitoring for afib with either a 30 day event monitor or an implantable loop recorder.  Risks, benefits, and alteratives to implantable loop recorder were discussed with the patient today.   At this time, the patient is undecided on how she would like to proceed from a monitoring standpoint, she and her husband would like to give it some more thought.    Renee Norberto SorensonLynn Ursuy, PA-C 11/13/2015  I have seen, examined the patient, and reviewed the above assessment and plan.  On exam, RRR.   Some difficulty with thought processing. Changes to above are made where necessary.     I have spoken at length with patient and family member at bedside.  The  patient is clear that she does not wish to have implantable loop recorder placed.  She would prefer to have 30 day monitor arranged by primary care physician in [place] (so that she does not have to come back to [place] to have this placed).  Primary team to arrange.  Electrophysiology team to see as needed while here. Please call with questions.   Co Sign: Hillis RangeJames Jasara Corrigan, MD 11/13/2015 7:55 AM

## 2015-11-13 NOTE — CV Procedure (Signed)
See full TEE report in [employer] Pt sedated with versed 6 mg and fentanyl 50 micrograms IV. Normal LV function; mild AI; negative saline microcavitation study; full report to follow. Olga MillersBrian Bretta Fees, MD

## 2015-11-14 LAB — VAS US CAROTID
LEFT ECA DIAS: -10 cm/s
LEFT VERTEBRAL DIAS: -17 cm/s
LICADDIAS: -37 cm/s
LICAPDIAS: -18 cm/s
Left CCA dist dias: -19 cm/s
Left CCA dist sys: -75 cm/s
Left CCA prox dias: 16 cm/s
Left CCA prox sys: 97 cm/s
Left ICA dist sys: -115 cm/s
Left ICA prox sys: -44 cm/s
RIGHT ECA DIAS: -17 cm/s
RIGHT VERTEBRAL DIAS: -16 cm/s
Right CCA prox dias: -23 cm/s
Right CCA prox sys: -119 cm/s
Right cca dist sys: -70 cm/s

## 2015-11-15 ENCOUNTER — Encounter (HOSPITAL_COMMUNITY): Payer: Self-pay | Admitting: Cardiology

## 2015-11-19 LAB — PROTHROMBIN GENE MUTATION

## 2016-01-13 ENCOUNTER — Encounter: Payer: Self-pay | Admitting: Neurology

## 2016-01-13 ENCOUNTER — Ambulatory Visit (INDEPENDENT_AMBULATORY_CARE_PROVIDER_SITE_OTHER): Payer: BLUE CROSS/BLUE SHIELD | Admitting: Neurology

## 2016-01-13 VITALS — BP 156/79 | HR 51 | Ht 65.0 in | Wt 190.4 lb

## 2016-01-13 DIAGNOSIS — I639 Cerebral infarction, unspecified: Secondary | ICD-10-CM

## 2016-01-13 NOTE — Patient Instructions (Signed)
I had a long d/w patient about his recent stroke, risk for recurrent stroke/TIAs, personally independently reviewed imaging studies and stroke evaluation results and answered questions.Continue aspirin 325 mg daily  for secondary stroke prevention and maintain strict control of hypertension with blood pressure goal below 130/90, diabetes with hemoglobin A1c goal below 6.5% and lipids with LDL cholesterol goal below 70 mg/dL. I also advised the patient to eat a healthy diet with plenty of whole grains, cereals, fruits and vegetables, exercise regularly and maintain ideal body weight. I counseled the patient to take statins to control cholesterol. I recommend she start taking coenzyme Q 10 200 mg daily and consider switching from Lipitor to Zocor or Crestor and to discuss this with her primary physician. She may also consider possible participation in the RESPECT ESUS trial if interested. She was given written information to review at home and decide. Followup in the future with me my nurse practitioner in 6 months or call earlier if necessary  Stroke Prevention Some medical conditions and behaviors are associated with an increased chance of having a stroke. You may prevent a stroke by making healthy choices and managing medical conditions. HOW CAN I REDUCE MY RISK OF HAVING A STROKE?   Stay physically active. Get at least 30 minutes of activity on most or all days.  Do not smoke. It may also be helpful to avoid exposure to secondhand smoke.  Limit alcohol use. Moderate alcohol use is considered to be:  No more than 2 drinks per day for men.  No more than 1 drink per day for nonpregnant women.  Eat healthy foods. This involves:  Eating 5 or more servings of fruits and vegetables a day.  Making dietary changes that address high blood pressure (hypertension), high cholesterol, diabetes, or obesity.  Manage your cholesterol levels.  Making food choices that are high in fiber and low in saturated  fat, trans fat, and cholesterol may control cholesterol levels.  Take any prescribed medicines to control cholesterol as directed by your health care provider.  Manage your diabetes.  Controlling your carbohydrate and sugar intake is recommended to manage diabetes.  Take any prescribed medicines to control diabetes as directed by your health care provider.  Control your hypertension.  Making food choices that are low in salt (sodium), saturated fat, trans fat, and cholesterol is recommended to manage hypertension.  Ask your health care provider if you need treatment to lower your blood pressure. Take any prescribed medicines to control hypertension as directed by your health care provider.  If you are 52-39 years of age, have your blood pressure checked every 3-5 years. If you are 70 years of age or older, have your blood pressure checked every year.  Maintain a healthy weight.  Reducing calorie intake and making food choices that are low in sodium, saturated fat, trans fat, and cholesterol are recommended to manage weight.  Stop drug abuse.  Avoid taking birth control pills.  Talk to your health care provider about the risks of taking birth control pills if you are over 41 years old, smoke, get migraines, or have ever had a blood clot.  Get evaluated for sleep disorders (sleep apnea).  Talk to your health care provider about getting a sleep evaluation if you snore a lot or have excessive sleepiness.  Take medicines only as directed by your health care provider.  For some people, aspirin or blood thinners (anticoagulants) are helpful in reducing the risk of forming abnormal blood clots that can lead  to stroke. If you have the irregular heart rhythm of atrial fibrillation, you should be on a blood thinner unless there is a good reason you cannot take them.  Understand all your medicine instructions.  Make sure that other conditions (such as anemia or atherosclerosis) are  addressed. SEEK IMMEDIATE MEDICAL CARE IF:   You have sudden weakness or numbness of the face, arm, or leg, especially on one side of the body.  Your face or eyelid droops to one side.  You have sudden confusion.  You have trouble speaking (aphasia) or understanding.  You have sudden trouble seeing in one or both eyes.  You have sudden trouble walking.  You have dizziness.  You have a loss of balance or coordination.  You have a sudden, severe headache with no known cause.  You have new chest pain or an irregular heartbeat. Any of these symptoms may represent a serious problem that is an emergency. Do not wait to see if the symptoms will go away. Get medical help at once. Call your local emergency services (911 in [place] Do not drive yourself to the hospital.   This information is not intended to replace advice given to you by your health care provider. Make sure you discuss any questions you have with your health care provider.   Document Released: 06/09/2004 Document Revised: 05/23/2014 Document Reviewed: 11/02/2012 Elsevier Interactive Patient Education [employer]2016 [employer]

## 2016-01-13 NOTE — Progress Notes (Signed)
[facility] [address] [place]. [place] [address] [phone]       OFFICE FOLLOW-UP NOTE  Meghan. Marquesa Rath Brusca Date of Birth:  1960/08/24 Medical Record Number:  [id number]   HPI: Meghan Dominguez is a 59 year Caucasian lady seen today for first office follow-up visit following hospital admission for stroke in June 2017.Keyshawna H Keeny is an 55 y.o. female with a history of depression and anxiety, as well as newly diagnosed hypertension, transferred from [facility] further evaluation of acute strokes. Exact onset is unclear. Patient had a headache on 11/09/2015 which was thought to be related to blood pressure and she was given a prescription for hydrochlorothiazide. She felt generally bad on the following day but had no clear deficits. She saw her primary doctor on 11/11/2015 who noticed speech output difficulty and referred her to [facility] ED for further evaluation. MRI showed no acute posterior left MCA ischemic infarction involving parietal temporal region. In addition there were anterior and posterior punctate ischemic lesions involving the right MCA territory. She has not experienced weakness and numbness involving extremities. Mild right facial droop has been noted. NIH stroke score was 2. Her LKW is unknown. Patient was not administered IV t-PA secondary to unknown LKW. She was admitted for further evaluation and treatment. MRI scan of the brain showed bilateral MCA branch infarcts which were felt to be embolic. MRA showed no significant anterior circulation disease there is moderate distal left PCA stenosis. Carotid ultrasound showed no significant extra cranial stenosis. Transthoracic echo showed normal ejection fraction. Transesophageal echo showed no cardiac source of embolism or patent foramen ovale. LDL cholesterol was elevated at 130 mg percent. Hemoglobin A1c was 5.3. Lupus anticoagulant was negative. Anticardiolipin antibodies were negative. RPR was  negative. ESR was negative. HIV antibodies were none detected complement levels were negative ANA was negative. Telemetry monitoring did not show any paroxysmal atrial fibrillation. Patient was started on aspirin for stroke prevention. She states she's done well since discharge her speech has improved. He took a couple of weeks to return back to baseline. She initially return back to work after 3 weeks initially part-time but can last several weeks she's been working full-time. She had some trouble with calculations and fine motor skills involving the right hand but that seems to have improved now. Only occasionally when she is tired she may have some word finding difficulties. Patient denies any snoring, daytime sleepiness and sleep apnea. She discontinued Lipitor due to myalgias. She had some angioedema with the Prinivil and blood pressure medication was changed to ziac by her primary MD.k she states her blood pressure at home is doing all right and is in the 1:30 range but today slightly elevated at 156/79 in the office. She denies any prior history of DVT, pulmonary embolism, recurrent miscarriages or family history of strokes or heart attacks at a young age.   ROS:   14 system review of systems is positive for  no complaints today and all systems negative PMH:  Past Medical History:  Diagnosis Date  . Hypertension   . Panic attacks   . Stroke [facility])     Social History:  Social History   Social History  . Marital status: Married    Spouse name: N/A  . Number of children: N/A  . Years of education: N/A   Occupational History  . Not on file.   Social History Main Topics  . Smoking status: Never Smoker  . Smokeless tobacco: Never Used  .  Alcohol use No  . Drug use: No  . Sexual activity: Not on file   Other Topics Concern  . Not on file   Social History Narrative  . No narrative on file    Medications:   Current Outpatient Prescriptions on File Prior to Visit  Medication Sig  Dispense Refill  . aspirin 325 MG tablet Take 1 tablet (325 mg total) by mouth daily. 30 tablet 0  . ibuprofen (ADVIL,MOTRIN) 400 MG tablet Take 400 mg by mouth every 6 (six) hours as needed.    . sertraline (ZOLOFT) 100 MG tablet Take 1 tablet by mouth daily.     No current facility-administered medications on file prior to visit.     Allergies:   Allergies  Allergen Reactions  . Lisinopril Swelling  . Hydrodiuril [Hydrochlorothiazide]     Swelling in legs  . Lipitor [Atorvastatin]     Weakness in legs     Physical Exam General: well developed, well nourished, seated, in no evident distress Head: head normocephalic and atraumatic.  Neck: supple with no carotid or supraclavicular bruits Cardiovascular: regular rate and rhythm, no murmurs Musculoskeletal: no deformity Skin:  no rash/petichiae Vascular:  Normal pulses all extremities Vitals:   01/13/16 0924  BP: (!) 156/79  Pulse: (!) 51   Neurologic Exam Mental Status: Awake and fully alert. Oriented to place and time. Recent and remote memory intact. Attention span, concentration and fund of knowledge appropriate. Mood and affect appropriate.  Cranial Nerves: Fundoscopic exam reveals sharp disc margins. Pupils equal, briskly reactive to light. Extraocular movements full without nystagmus. Visual fields full to confrontation. Hearing intact. Facial sensation intact. Face, tongue, palate moves normally and symmetrically.  Motor: Normal bulk and tone. Normal strength in all tested extremity muscles. Sensory.: intact to touch ,pinprick .position and vibratory sensation.  Coordination: Rapid alternating movements normal in all extremities. Finger-to-nose and heel-to-shin performed accurately bilaterally. Gait and Station: Arises from chair without difficulty. Stance is normal. Gait demonstrates normal stride length and balance . Able to heel, toe and tandem walk without difficulty.  Reflexes: 1+ and symmetric. Toes downgoing.    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 Vascular risk factors of hypertension hyperlipidemia only    PLAN: I had a long d/w patient about his recent stroke, risk for recurrent stroke/TIAs, personally independently reviewed imaging studies and stroke evaluation results and answered questions.Continue aspirin 325 mg daily  for secondary stroke prevention and maintain strict control of hypertension with blood pressure goal below 130/90, diabetes with hemoglobin A1c goal below 6.5% and lipids with LDL cholesterol goal below 70 mg/dL. I also advised the patient to eat a healthy diet with plenty of whole grains, cereals, fruits and vegetables, exercise regularly and maintain ideal body weight. I counseled the patient to take statins to control cholesterol. I recommend she start taking coenzyme Q 10 200 mg daily and consider switching from Lipitor to Zocor or Crestor and to discuss this with her primary physician. She may also consider possible participation in the RESPECT ESUS trial if interested. She was given written information to review at home and decide. Followup in the future with me my nurse practitioner in 6 months or call earlier if necessary Greater than 50% of time during this 25 minute visit was spent on counseling,explanation of diagnosis, planning of further management, discussion with patient and family and coordination of care Delia Heady,  MD  [facility] [address] [place], [place] [address]  Phone [phone] Fax [phone] Note: This document was prepared with digital dictation and possible smart phrase technology. Any transcriptional errors that result from this process are unintentional

## 2016-07-19 ENCOUNTER — Ambulatory Visit: Payer: BLUE CROSS/BLUE SHIELD | Admitting: Nurse Practitioner

## 2017-07-16 IMAGING — MR MR MRA HEAD W/O CM
1 series · 20 of 48 positions shown · non-contrast
Comparison: Brain MRI from yesterday

CLINICAL DATA: New onset speech difficulty.

EXAM:
MRA HEAD WITHOUT CONTRAST
TECHNIQUE: Angiographic images of the Circle of Willis were obtained using MRA
technique without intravenous contrast.

[Series 3: ax (id) 2 · axial · 1.0mm · 0.43mm/px · z∈[-87,-0]mm · 20 of 184 slices shown]
[im 1/184]
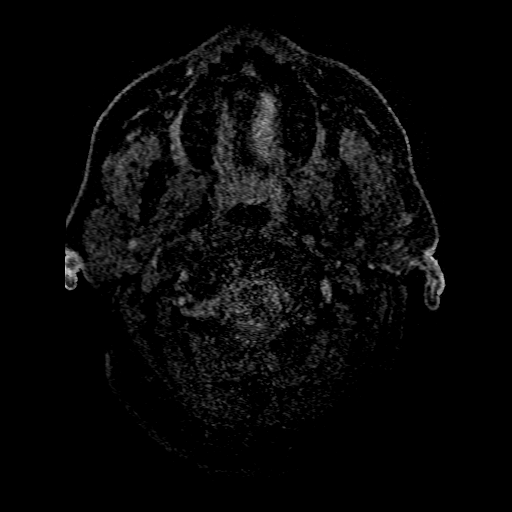
[im 4/184]
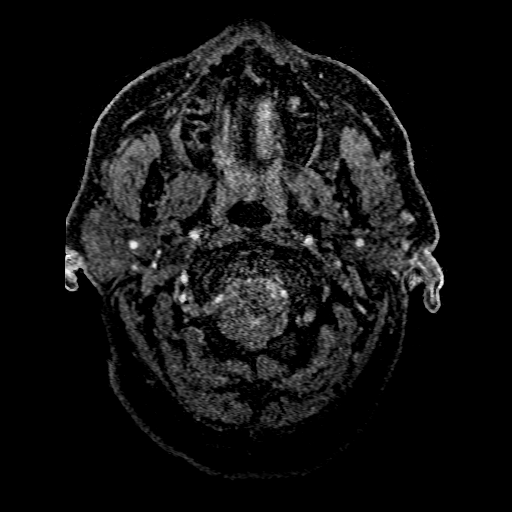
[im 8/184]
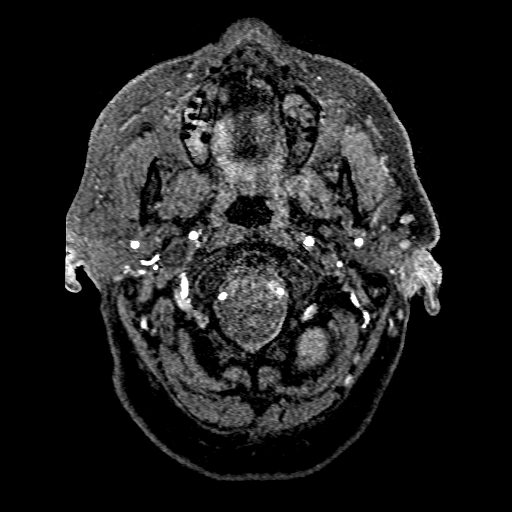
[im 12/184]
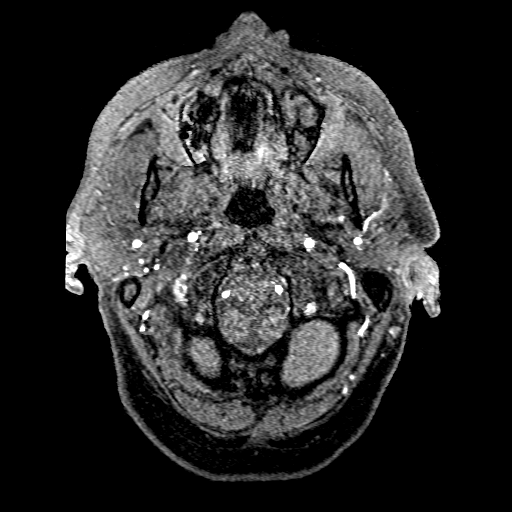
[im 16/184]
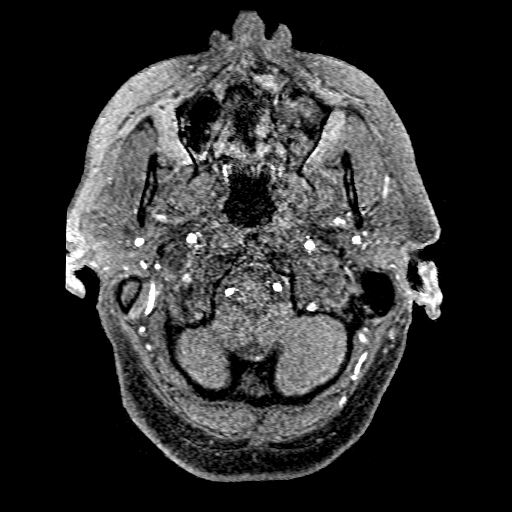
[im 20/184]
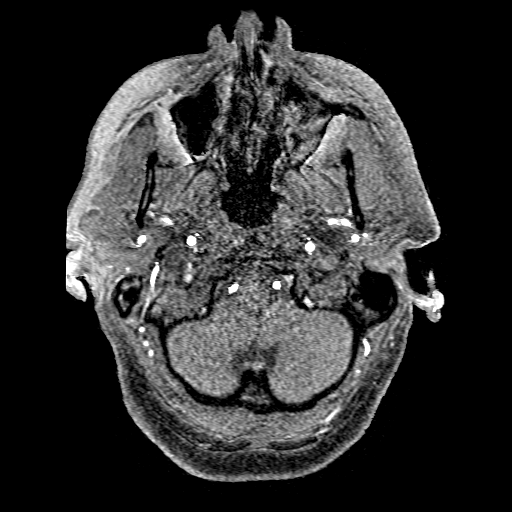
[im 24/184]
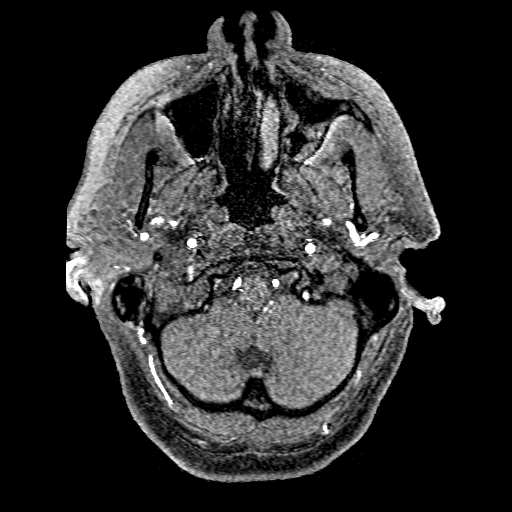
[im 28/184]
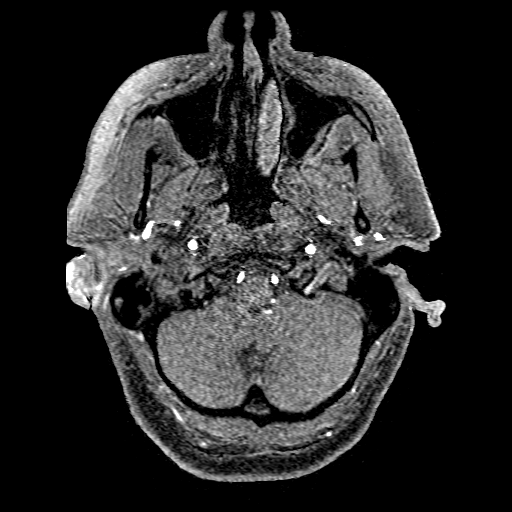
[im 32/184]
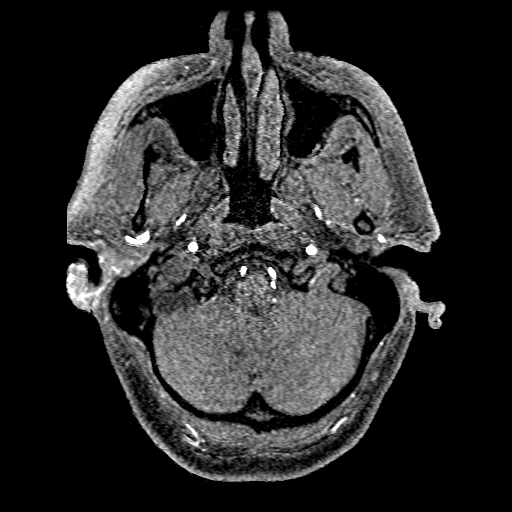
[im 36/184]
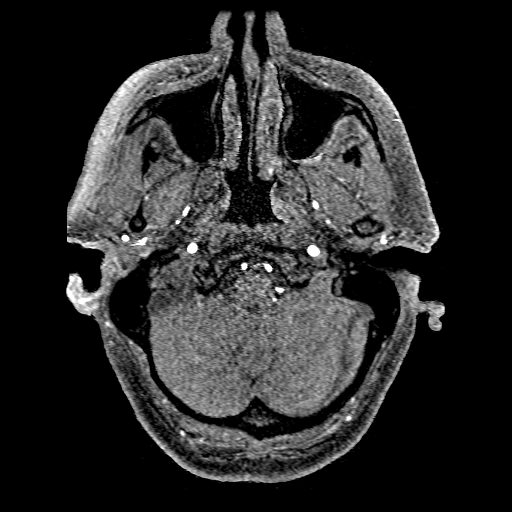
[im 39/184]
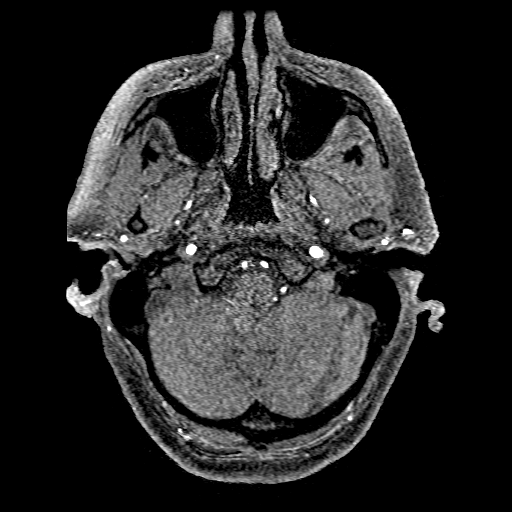
[im 43/184]
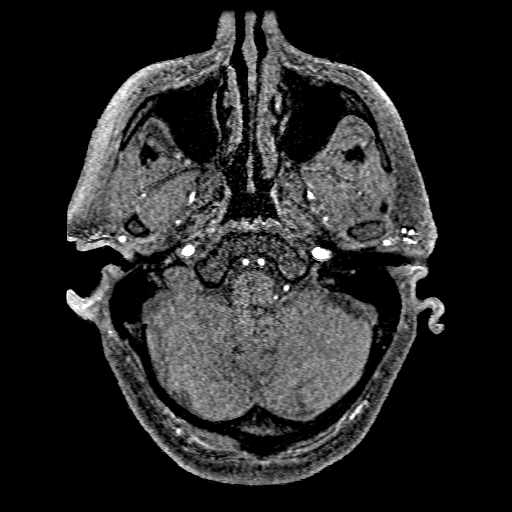
[im 59/184]
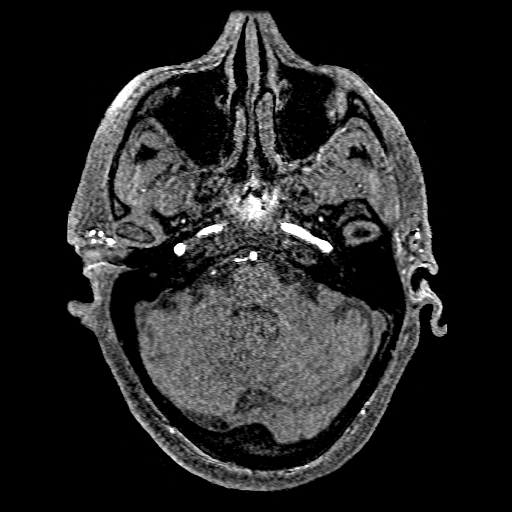
[im 82/184]
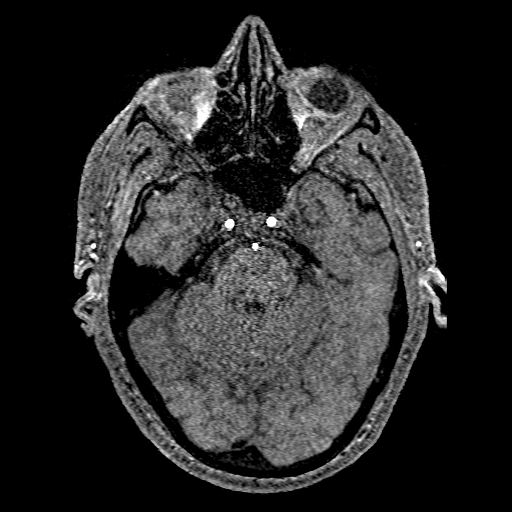
[im 94/184]
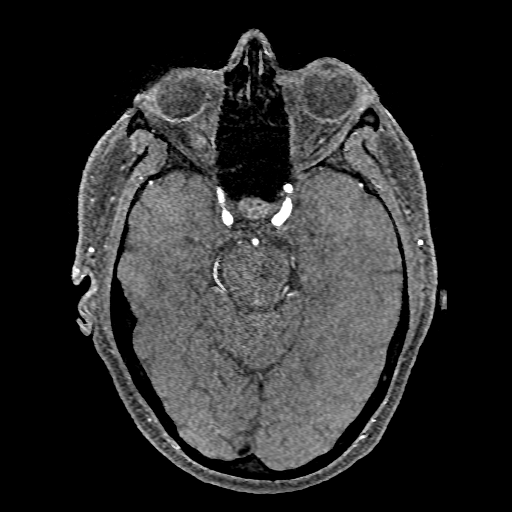
[im 106/184]
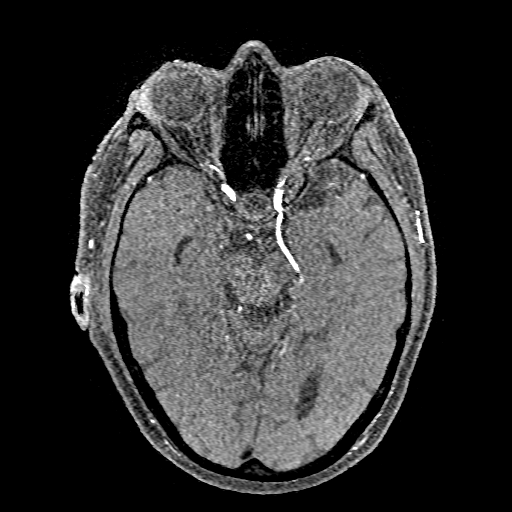
[im 129/184]
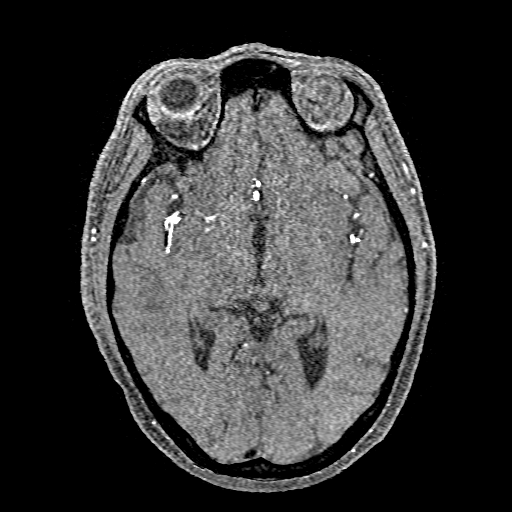
[im 152/184]
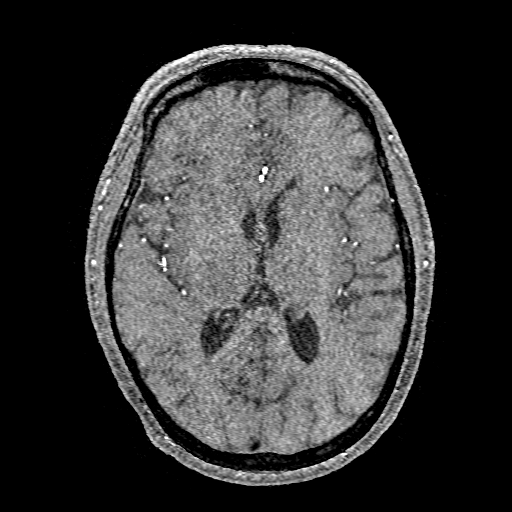
[im 156/184]
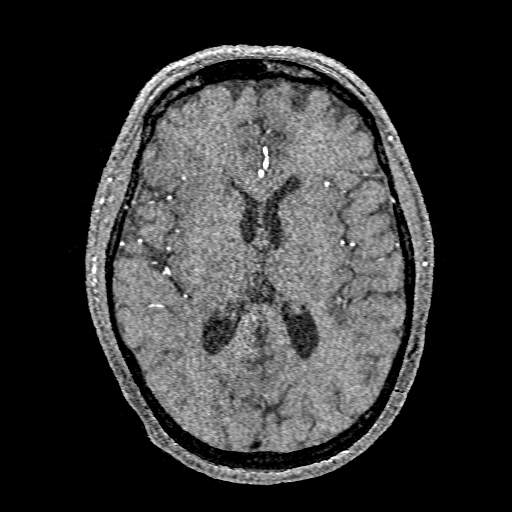
[im 176/184]
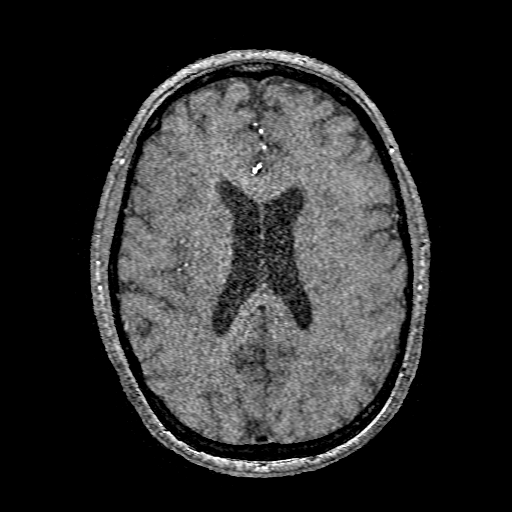

[20 of 48 positions shown; findings below may reference images not displayed]

FINDINGS: Symmetric carotid arteries and branching. Mild siphon undulation is
likely atherosclerotic in this patient with calcification in this
region by recent head CT. No major branch occlusion or flow limiting
stenosis.

Symmetric vertebral arteries. Dominant right AICA and left PICA.
Fetal type PCA on the left. No major branch occlusion or treatable
flow limiting stenosis. Moderate left P3/4 segment narrowing that is
likely atherosclerotic. No generalized beading.

1 mm outpouching from the right supraclinoid ICA which appears
distinct from a tiny PCOM.
IMPRESSION: 1. No major vessel occlusion or treatable stenosis.
2. Moderate distal left PCA stenosis.
3. 1 mm right supraclinoid ICA outpouching, usually infundibulum but
small aneurysm cannot be excluded.

## 2017-11-08 ENCOUNTER — Encounter: Payer: Self-pay | Admitting: Neurology

## 2018-01-12 NOTE — Progress Notes (Signed)
NEUROLOGY CONSULTATION NOTE  Meghan Dominguez MRN: [id number] DOB: November 24, 1960  Referring provider: Dorette GrateLisa Corum, MD Primary care provider: Dorette GrateLisa Corum, MD  Reason for consult:  Stroke  HISTORY OF PRESENT ILLNESS: Meghan Dominguez is a 57 year old right-handed female with hypertension and history of stroke who presents for stroke.  History supplemented by prior hospital records.  MRI/MRAs from hospitalization were personally reviewed.  She presented to her PCP's office on 11/11/15 for headache, in the back of her head.  She had recently been diagnosed with hypertension.  Her PCP noted she exhibited difficulty with verbal output as well as numbness and difficulty with fine motor skills of her right hand (difficulty writing).  She was sent to [facility] MRI of brain showed acute left MCA territory infarct as well as anterior and posterior right hemispheric punctate infarcts.  She was noted to have mild right facial droop.  NIHSS was 2.  Due to unknown time of onset, she did not get tPA.  She was [facility].  MRA of head showed moderate distal left PCA stenosis but no significant stenosis or occlusion. Carotid doppler revealed no hemodynamically significant stenosis.  TTE showed EF 60-65% with no cardiac source of embolus.  TEE showed no PFO or SOE.  Hypercoagulable panel (lupus anticoagulant, anticardiolipion antibodies) and vasculitic labs (ANA, Sed Rate, RPR, HIV) were negative.  Hgb A1c was 5.3.  LDL was 130.  She was started on Lipitor and ASA.  She declined a loop recorder.  A 30 day event monitor was planned but never performed.  She subsequently discontinued Lipitor due to myalgias.  She is currently not on a statin.  She continues to take ASA 81mg  daily.  Up until recently, she had difficulty controlling her blood pressure.  She denies residual symptoms.  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  She feels back to baseline.  However, back in June, she was experiencing  numbness and tingling.  She had her diuretic changed and it was resolved.   PAST MEDICAL HISTORY: Past Medical History:  Diagnosis Date  . Hypertension   . Panic attacks   . Stroke [facility](HCC)     PAST SURGICAL HISTORY: Past Surgical History:  Procedure Laterality Date  . TEE WITHOUT CARDIOVERSION N/A 11/13/2015   Procedure: TRANSESOPHAGEAL ECHOCARDIOGRAM (TEE)   (LOOP);  Surgeon: Lewayne BuntingBrian S Crenshaw, MD;  Location[facility];  Service: Cardiovascular;  Laterality: N/A;    MEDICATIONS: Current Outpatient Medications on File Prior to Visit  Medication Sig Dispense Refill  . aspirin 325 MG tablet Take 1 tablet (325 mg total) by mouth daily. 30 tablet 0  . BIOTIN PO Take 10,000 mg by mouth.    . bisoprolol-hydrochlorothiazide (ZIAC) 5-6.25 MG tablet TAKE 1 TABLET BY MOUTH DAILY.    Marland Kitchen. ibuprofen (ADVIL,MOTRIN) 400 MG tablet Take 400 mg by mouth every 6 (six) hours as needed.    . Multiple Vitamins-Minerals (MULTIVITAMIN ADULT PO) Take by mouth.    . sertraline (ZOLOFT) 100 MG tablet Take 1 tablet by mouth daily.     No current facility-administered medications on file prior to visit.     ALLERGIES: Allergies  Allergen Reactions  . Lisinopril Swelling  . Hydrodiuril [Hydrochlorothiazide]     Swelling in legs  . Lipitor [Atorvastatin]     Weakness in legs     FAMILY HISTORY: Family History  Problem Relation Age of Onset  . Stroke Father   . Stroke Maternal Uncle    SOCIAL HISTORY:  Social History   Socioeconomic History  . Marital status: Married    Spouse name: Not on file  . Number of children: Not on file  . Years of education: Not on file  . Highest education level: Not on file  Occupational History  . Not on file  Social Needs  . Financial resource strain: Not on file  . Food insecurity:    Worry: Not on file    Inability: Not on file  . Transportation needs:    Medical: Not on file    Non-medical: Not on file  Tobacco Use  . Smoking status: Never Smoker  .  Smokeless tobacco: Never Used  Substance and Sexual Activity  . Alcohol use: No  . Drug use: No  . Sexual activity: Not on file  Lifestyle  . Physical activity:    Days per week: Not on file    Minutes per session: Not on file  . Stress: Not on file  Relationships  . Social connections:    Talks on phone: Not on file    Gets together: Not on file    Attends religious service: Not on file    Active member of club or organization: Not on file    Attends meetings of clubs or organizations: Not on file    Relationship status: Not on file  . Intimate partner violence:    Fear of current or ex partner: Not on file    Emotionally abused: Not on file    Physically abused: Not on file    Forced sexual activity: Not on file  Other Topics Concern  . Not on file  Social History Narrative  . Not on file    REVIEW OF SYSTEMS: Constitutional: No fevers, chills, or sweats, no generalized fatigue, change in appetite Eyes: No visual changes, double vision, eye pain Ear, nose and throat: No hearing loss, ear pain, nasal congestion, sore throat Cardiovascular: No chest pain, palpitations Respiratory:  No shortness of breath at rest or with exertion, wheezes GastrointestinaI: No nausea, vomiting, diarrhea, abdominal pain, fecal incontinence Genitourinary:  No dysuria, urinary retention or frequency Musculoskeletal:  No neck pain, back pain Integumentary: No rash, pruritus, skin lesions Neurological: as above Psychiatric: No depression, insomnia, anxiety Endocrine: No palpitations, fatigue, diaphoresis, mood swings, change in appetite, change in weight, increased thirst Hematologic/Lymphatic:  No purpura, petechiae. Allergic/Immunologic: no itchy/runny eyes, nasal congestion, recent allergic reactions, rashes  PHYSICAL EXAM: Blood pressure 120/80, pulse 67, height 5\' 5"  (1.651 m), weight 202 lb 5 oz (91.8 kg), SpO2 99 %. General: No acute distress.  Patient appears well-groomed.  Head:   Normocephalic/atraumatic Eyes:  fundi examined but not visualized Neck: supple, no paraspinal tenderness, full range of motion Back: No paraspinal tenderness Heart: regular rate and rhythm Lungs: Clear to auscultation bilaterally. Vascular: No carotid bruits. Neurological Exam: Mental status: alert and oriented to person, place, and time, recent and remote memory intact, fund of knowledge intact, attention and concentration intact, speech fluent and not dysarthric, language intact. Cranial nerves: CN I: not tested CN II: pupils equal, round and reactive to light, visual fields intact CN III, IV, VI:  full range of motion, no nystagmus, no ptosis CN V: facial sensation intact CN VII: upper and lower face symmetric CN VIII: hearing intact CN IX, X: gag intact, uvula midline CN XI: sternocleidomastoid and trapezius muscles intact CN XII: tongue midline Bulk & Tone: normal, no fasciculations. Motor:  5/5 throughout  Sensation:  Pinprick and vibration sensation intact. Deep  Tendon Reflexes:  2+ throughout, toes downgoing.  Finger to nose testing:  Without dysmetria.  Heel to shin:  Without dysmetria.  Gait:  Normal station and stride.  Able to turn and tandem walk. Romberg negative.  IMPRESSION: 1.  History of left MCA territory infarcts (as well as punctate infarcts in the right hemisphere), likely embolic of unknown source.  As there is a suspicion that the stroke may have been cardioembolic, further evaluation for paroxysmal atrial fibrillation recommended, as this would change management (switching from antiplatelet therapy to anticoagulant). 2.  Hypertension 3.  Hyperlipidemia  PLAN: 1.  She defers 30 day Holter monitor at this time.  If she changes her mind, she will contact me and we can set this up. 2.  Ideally, she should be on statin therapy if her LDL is above 100.  I asked that she discuss with Dr. Judee Clara and consider trying Crestor or another alternative statin. 3.  Continue  aspirin 81mg  daily for secondary stroke prevention 4.  Continue blood pressure control 5.  Follow up as needed.  Thank you for allowing me to take part in the care of this patient.  Shon Millet, DO  CC:  Dorette Grate, MD

## 2018-01-16 ENCOUNTER — Ambulatory Visit: Payer: BLUE CROSS/BLUE SHIELD | Admitting: Neurology

## 2018-01-16 ENCOUNTER — Encounter: Payer: Self-pay | Admitting: Neurology

## 2018-01-16 VITALS — BP 120/80 | HR 67 | Ht 65.0 in | Wt 202.3 lb

## 2018-01-16 DIAGNOSIS — I63412 Cerebral infarction due to embolism of left middle cerebral artery: Secondary | ICD-10-CM

## 2018-01-16 DIAGNOSIS — I10 Essential (primary) hypertension: Secondary | ICD-10-CM

## 2018-01-16 DIAGNOSIS — E785 Hyperlipidemia, unspecified: Secondary | ICD-10-CM | POA: Diagnosis not present

## 2018-01-16 NOTE — Patient Instructions (Signed)
1.  Please consider having a 30 day Holter monitor to look for any arrhythmia that would potentially cause a stroke.  If we see it, then it would mean changing aspirin to another type of blood thinner.  Contact me if you wish to have this done. 2.  Discuss with Dr. Judee Clara about trying a different statin medication for cholesterol
